# Patient Record
Sex: Female | Born: 1955 | Race: White | Hispanic: No | Smoking: Never smoker
Health system: Southern US, Community
[De-identification: ages and names within clinical notes are randomized; demographics above are authoritative.]

## PROBLEM LIST (undated history)

## (undated) DIAGNOSIS — K219 Gastro-esophageal reflux disease without esophagitis: Secondary | ICD-10-CM

## (undated) DIAGNOSIS — N189 Chronic kidney disease, unspecified: Secondary | ICD-10-CM

## (undated) DIAGNOSIS — I10 Essential (primary) hypertension: Secondary | ICD-10-CM

## (undated) DIAGNOSIS — E119 Type 2 diabetes mellitus without complications: Secondary | ICD-10-CM

## (undated) DIAGNOSIS — E7800 Pure hypercholesterolemia, unspecified: Secondary | ICD-10-CM

## (undated) DIAGNOSIS — E669 Obesity, unspecified: Secondary | ICD-10-CM

## (undated) DIAGNOSIS — E039 Hypothyroidism, unspecified: Secondary | ICD-10-CM

## (undated) DIAGNOSIS — M1990 Unspecified osteoarthritis, unspecified site: Secondary | ICD-10-CM

## (undated) HISTORY — PX: FOOT SURGERY: SHX648

## (undated) HISTORY — DX: Obesity, unspecified: E66.9

## (undated) HISTORY — DX: Gastro-esophageal reflux disease without esophagitis: K21.9

## (undated) HISTORY — DX: Essential (primary) hypertension: I10

## (undated) HISTORY — DX: Pure hypercholesterolemia, unspecified: E78.00

## (undated) HISTORY — DX: Hypothyroidism, unspecified: E03.9

## (undated) HISTORY — DX: Unspecified osteoarthritis, unspecified site: M19.90

## (undated) HISTORY — DX: Type 2 diabetes mellitus without complications: E11.9

## (undated) HISTORY — DX: Chronic kidney disease, unspecified: N18.9

## (undated) HISTORY — PX: TUBAL LIGATION: SHX77

---

## 1999-01-06 ENCOUNTER — Other Ambulatory Visit: Admission: RE | Admit: 1999-01-06 | Discharge: 1999-01-06 | Payer: Self-pay | Admitting: Family Medicine

## 1999-05-24 ENCOUNTER — Encounter: Admission: RE | Admit: 1999-05-24 | Discharge: 1999-07-08 | Payer: Self-pay | Admitting: *Deleted

## 1999-07-12 ENCOUNTER — Ambulatory Visit (HOSPITAL_COMMUNITY): Admission: RE | Admit: 1999-07-12 | Discharge: 1999-07-12 | Payer: Self-pay | Admitting: Occupational Medicine

## 1999-07-12 ENCOUNTER — Encounter: Payer: Self-pay | Admitting: Occupational Medicine

## 1999-07-29 ENCOUNTER — Encounter: Admission: RE | Admit: 1999-07-29 | Discharge: 1999-07-29 | Payer: Self-pay | Admitting: Family Medicine

## 1999-07-29 ENCOUNTER — Encounter: Payer: Self-pay | Admitting: Family Medicine

## 1999-11-02 ENCOUNTER — Ambulatory Visit (HOSPITAL_COMMUNITY): Admission: RE | Admit: 1999-11-02 | Discharge: 1999-11-02 | Payer: Self-pay | Admitting: Orthopedic Surgery

## 1999-11-02 ENCOUNTER — Encounter: Payer: Self-pay | Admitting: Orthopedic Surgery

## 1999-12-17 ENCOUNTER — Encounter: Payer: Self-pay | Admitting: Orthopedic Surgery

## 1999-12-17 ENCOUNTER — Ambulatory Visit (HOSPITAL_COMMUNITY): Admission: RE | Admit: 1999-12-17 | Discharge: 1999-12-17 | Payer: Self-pay | Admitting: Orthopedic Surgery

## 2000-03-01 ENCOUNTER — Ambulatory Visit (HOSPITAL_COMMUNITY): Admission: RE | Admit: 2000-03-01 | Discharge: 2000-03-01 | Payer: Self-pay | Admitting: Family Medicine

## 2000-03-01 ENCOUNTER — Encounter: Payer: Self-pay | Admitting: Family Medicine

## 2003-01-23 ENCOUNTER — Ambulatory Visit (HOSPITAL_COMMUNITY): Admission: RE | Admit: 2003-01-23 | Discharge: 2003-01-23 | Payer: Self-pay | Admitting: Family Medicine

## 2003-02-25 ENCOUNTER — Other Ambulatory Visit: Admission: RE | Admit: 2003-02-25 | Discharge: 2003-02-25 | Payer: Self-pay | Admitting: Family Medicine

## 2004-02-15 DIAGNOSIS — G459 Transient cerebral ischemic attack, unspecified: Secondary | ICD-10-CM

## 2004-02-15 HISTORY — DX: Transient cerebral ischemic attack, unspecified: G45.9

## 2004-09-29 ENCOUNTER — Emergency Department (HOSPITAL_COMMUNITY): Admission: EM | Admit: 2004-09-29 | Discharge: 2004-09-29 | Payer: Self-pay | Admitting: Emergency Medicine

## 2004-10-01 ENCOUNTER — Inpatient Hospital Stay (HOSPITAL_COMMUNITY): Admission: EM | Admit: 2004-10-01 | Discharge: 2004-10-05 | Payer: Self-pay | Admitting: Emergency Medicine

## 2004-10-02 ENCOUNTER — Ambulatory Visit: Payer: Self-pay | Admitting: Cardiology

## 2004-10-04 ENCOUNTER — Encounter: Payer: Self-pay | Admitting: Cardiology

## 2004-11-12 ENCOUNTER — Ambulatory Visit: Payer: Self-pay | Admitting: Cardiology

## 2004-12-02 ENCOUNTER — Ambulatory Visit: Payer: Self-pay | Admitting: Cardiology

## 2004-12-29 ENCOUNTER — Encounter: Admission: RE | Admit: 2004-12-29 | Discharge: 2004-12-29 | Payer: Self-pay | Admitting: Neurology

## 2005-04-07 ENCOUNTER — Ambulatory Visit: Payer: Self-pay | Admitting: Cardiology

## 2005-09-15 ENCOUNTER — Ambulatory Visit: Payer: Self-pay | Admitting: Cardiology

## 2006-04-06 ENCOUNTER — Ambulatory Visit: Payer: Self-pay | Admitting: Cardiology

## 2007-02-27 ENCOUNTER — Ambulatory Visit (HOSPITAL_COMMUNITY): Admission: RE | Admit: 2007-02-27 | Discharge: 2007-02-27 | Payer: Self-pay | Admitting: Orthopedic Surgery

## 2007-03-29 ENCOUNTER — Ambulatory Visit: Payer: Self-pay | Admitting: Family Medicine

## 2007-03-29 ENCOUNTER — Inpatient Hospital Stay (HOSPITAL_COMMUNITY): Admission: EM | Admit: 2007-03-29 | Discharge: 2007-04-01 | Payer: Self-pay | Admitting: Emergency Medicine

## 2008-07-17 ENCOUNTER — Telehealth: Payer: Self-pay | Admitting: Cardiology

## 2010-03-06 ENCOUNTER — Encounter: Payer: Self-pay | Admitting: Family Medicine

## 2010-03-07 ENCOUNTER — Encounter: Payer: Self-pay | Admitting: Family Medicine

## 2010-03-31 ENCOUNTER — Other Ambulatory Visit (HOSPITAL_COMMUNITY): Payer: Self-pay | Admitting: Family Medicine

## 2010-03-31 DIAGNOSIS — Z1231 Encounter for screening mammogram for malignant neoplasm of breast: Secondary | ICD-10-CM

## 2010-04-06 ENCOUNTER — Ambulatory Visit (HOSPITAL_COMMUNITY)
Admission: RE | Admit: 2010-04-06 | Discharge: 2010-04-06 | Disposition: A | Discharge status: Home / Self Care | Payer: Medicare HMO | Source: Ambulatory Visit | Attending: Family Medicine | Admitting: Family Medicine

## 2010-04-06 ENCOUNTER — Encounter (HOSPITAL_COMMUNITY): Payer: Self-pay

## 2010-04-06 DIAGNOSIS — Z1231 Encounter for screening mammogram for malignant neoplasm of breast: Secondary | ICD-10-CM | POA: Insufficient documentation

## 2010-06-29 NOTE — H&P (Signed)
Sherry Herrera, Sherry Herrera             ACCOUNT NO.:  [id number]   MEDICAL RECORD NO.:  [id number]          PATIENT TYPE:  INP   LOCATION:  3035                         FACILITY:  [facility]   PHYSICIAN:  Zenaida Deed. Mayford Knife, M.D.DATE OF BIRTH:  1955/12/14   DATE OF ADMISSION:  03/29/2007  DATE OF DISCHARGE:                              HISTORY & PHYSICAL   CHIEF COMPLAINT:  Headache x2 weeks.   HISTORY OF PRESENT ILLNESS:  The patient is a 55 year old woman who was  in her usual state of health 2 weeks ago when she recalls gradual onset  of nausea, vomiting then headache.  The syndrome has been persistent  since then.  She visited her PCP 2 weeks ago who performed some lab  tests and then sent her to [facility] for CT and LP.  Review of  [facility] ER records reveal normal CT scan.  LP was performed  after two attempts and noted to be traumatic.  Cultures were sent which  were negative x72 hours, Gram stain with PMNs, no organisms.  Glucose  44, protein 116, grossly bloody, WBCs 250, 87% neutrophils, RBCs 143.  CBC with WBC 12.2, platelets 401.  UA negative.  Potassium was 3.4.  No  viral PCR studies were sent.   The patient notes subjective fever and chills.  She denies visual  changes, photophobia, phonophobia.  Headache is pulsating along the  right upper temporal region.  Worse with head movement.  She notes  longstanding mild shortness of breath without chest pain.  She denies  motor focality acutely.  She notes a longstanding numbness in her hands  secondary to shoulder synovitis and biceps and rotator cuff pathology  previously diagnosed.  She is not able to keep down fluids or food.  She  had a bowel movement 2 days ago and says she feels dehydrated.   PAST MEDICAL HISTORY:  1. Right TIA 2006.  2. Hyperlipidemia.  3. Hypertension.  4. Chest pain with abnormal Myoview 2008 with apical ischemia, EF of      79%.  5. Intermittent shortness of breath not further  investigated.  6. Depression.  7. Obesity.  8. Shoulder synovitis.  9. Biceps tendinopathy.   PAST SURGICAL HISTORY:  1. Remote BTL.  2. 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  3. Family history of dyslipidemia, stroke; no history of coronary      artery disease.   SOCIAL HISTORY:  Denies tobacco or alcohol.  Lives in [place].  She is married.   ALLERGIES:  No known drug allergies.   MEDICATIONS:  1. Hydrochlorothiazide 50 mg daily.  2. Levothyroxine 25 mcg daily.  3. Metoprolol XL 50 mg daily.  4. Zoloft 50 mg daily.  5. Simvastatin 40 mg daily.   REVIEW OF SYSTEMS:  Reports chills, neck pain, hand paresthesia,  headache, nausea, vomiting, shortness of breath.  Denies phonophobia,  photophobia, chest pain, motor weakness, abdominal pain, dysuria,  changes in bowels, new rash.   PHYSICAL EXAMINATION:  VITAL SIGNS:  T max 97.7, heart rate 78, BP  167/84, respirations 18, O2 sat 97% on room air.  GENERAL:  The patient is lying on the ER bed with the lights off and  blanket over her head.  She is awake and alert.  HEENT:  Normocephalic, atraumatic.  Pupils equally round, reactive to  light.  Extraocular muscles are intact.  Funduscopy suggestive of  papilledema.  NECK:  Neck is tender at the occiput.  No JVD.  Heart rate regular.  No  murmur or thrill.  LUNGS:  Clear.  ABDOMEN:  Obese, nontender.  Positive bowel sounds.  No edema.  Diffuse  scaling plaque distal to ankle bilaterally.  NEUROLOGICAL:  Cranial nerves II-XII intact.  Upper extremity strength  5/5, sensation intact to touch.  No tremor.  Reflexes 2+.  Judgment and  insight intact.   LABORATORY AND SCANS:  CT head no acute process.  EKG with sinus  bradycardia, PVCs, LVH and nonspecific ST-T changes.  Review of EKG from  2007 [facility] reveals consistent findings.  Repeat rhythm  strip done on exam with heart rate 78.  CBC - WBC 10.3, hemoglobin 14.9,  platelets 405.  BMET - potassium  2.8, otherwise normal.  Troponin-I  0.01.  Urinalysis negative.   ASSESSMENT:  The patient is a 56 year old woman with headache, neck  pain, chills x2 weeks.  1. CNS:  No signs and symptoms of encephalitis.  No clear vector      exposure.  No exanthem or parotitis or cervical lymphadenopathy.      Rocephin given in the ER x1.  Will not continue.  CSF negative      culture, negative for [employer].  Will follow the      patient for viral meningitis.  [facility], they      still have CSF.  Have requested HSV, PCR.  Will give the patient      acyclovir and consider repeating CSF evaluation in the a.m. with      new LP.  Given report of RBCs and the CHF with the patient's      papilledema concerning for HSV.  The patient has no frank temporal      lobe symptoms which is somewhat reassuring.  At this time given      temporality of symptoms will not get Lyme, RMSF, CMV or EBV titers.      The patient has already gotten Rocephin so blood cultures at this      point would be nondiagnostic.  Will give morphine and antiemetics      for symptomatic relief.  2. Hypertension:  Elevated blood pressure with papilledema.      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  No evidence of      cerebrovascular event on CT.  The patient has prior cardiac      ischemia on Myoview and chronicity of hypertension is unknown so I      am somewhat hesitant to aggressively lower BP at this time.      However, the patient needs control.  Will initiate carvedilol with      calcium channel blocker.  Given hypokalemia and electrolyte      abnormalities will hold hydrochlorothiazide.  While the patient has      earlier finding of bradycardia with papilledema she does not have      an increased respiratory rate and does not fit the Cushing's triad.      Will follow for recurrent bradycardia.  3. F/E/N/GI:  Hypokalemic.  Will replete with potassium runs and then      give  intravenous fluids.   Keep n.p.o. for now except for      medications.  Morphine, antiemetics.  Will follow I's and O's and      give docusate senna to compensate for morphine effects.  Zofran IV      with staggered Phenergan as needed.  Continue levothyroxine.  4. Hypercholesterolemia:  Will check a.m. lipid panel and CMET.  5. Prophylaxis:  Protonix 40 mg IV daily.  Given LP recently will hold      Lovenox and place SCDs.   DISPOSITION:  Pending HSV titers, clinical course and question of repeat  LP.      Towana Badger, M.D.  Electronically Signed      Zenaida Deed. Mayford Knife, M.D.  Electronically Signed    JP/MEDQ  D:  03/29/2007  T:  03/31/2007  Job:  [id number]

## 2010-07-02 NOTE — Consult Note (Signed)
NAMEVERNIE, VINCIGUERRA             ACCOUNT NO.:  [id number]   MEDICAL RECORD NO.:  [id number]          PATIENT TYPE:  INP   LOCATION:  3036                         FACILITY:  [facility]   PHYSICIAN:  Jonelle Sidle, M.D. LHCDATE OF BIRTH:  1955/03/19   DATE OF CONSULTATION:  10/02/2004  DATE OF DISCHARGE:                                   CONSULTATION   PRIMARY CARE PHYSICIAN:  Dr. Manson Passey in [facility].   REASON FOR CONSULTATION:  Left arm discomfort.   HISTORY OF PRESENT ILLNESS:  Sherry Herrera is a pleasant 55 year old woman  with a reported history of hypertension, depression and apparently  borderline dyslipidemia.  She is admitted to the hospital now given concerns  for possible transient ischemic attack, is undergoing neurological  evaluation.  She describes developing a sensation of numbness that began  this past Wednesday when she stood up to adjust the controls of an air  conditioner in her daughters house.  She states that she stood up and walked  a few steps, reaching up with her right arm and then noticed a numbness  beginning in the fingertips of her left arm which gradually crawled up her  left arm to the level of her face, subsequently experiencing numbness in the  perioral area and left half of her face.  This lasted for 10 seconds and  abated.  She states that it happened again when she stood up a little while  later and then for a third time prompting evaluation at a local emergency  department.  The patient tells me that she was evaluated and discharged  apparently without any acute findings.  She had on additional episode that  day, but then apparently none until presenting to the hospital yesterday.  Since hospitalization she has not had any further symptoms.   Sherry Herrera denies having antecedent history of exertional chest pain or  shortness of breath, although she does state that she had a slight episode  of chest pain, she was playing with her two  granddaughters two weeks ago.  She was running the hall when this occurred.  With the recent episodes of  left arm numbness, she has not experienced any chest pain or shortness of  breath.  She denies any prior cardiovascular testing or cardiac events.  Of  note her recent point of care cardiac markers are normal.  She does have a  potassium of 2.9 and has been on hydrochlorothiazide 50 mg p.o. daily  without potassium supplementation.  Her electrocardiogram showed sinus  rhythm at 80 beats per minute with nonspecific ST-T wave changes and  baseline artifact.  T waves are somewhat flat and actual QT interval is hard  to completely assess, although could be mildly prolonged.  Chest x-ray is  reported as showing no acute cardiopulmonary disease and CT scan of the head  from the 16th is reported as negative for acute event.  We have been asked  to evaluate the patient further.   ALLERGIES:  NO KNOWN DRUG ALLERGIES.   MEDICATIONS AT HOME:  1.  Hydrochlorothiazide 50 mg p.o. daily.  2.  Zoloft 50 mg p.o. daily.  3.  Aspirin 81 mg p.o. daily.   PAST MEDICAL HISTORY:  1.  Hypertension.  The patient was previously on an ACE inhibitor although      this was discontinued due to cough.  She has been treated since 1997.  2.  Borderline dyslipidemia.  3.  Depression.   SOCIAL HISTORY:  The patient denies any significant tobacco or alcohol use.  She lives in [place].  She has no major functional limitations.   FAMILY HISTORY:  Significant for dyslipidemia as well as stroke in the  patient's father.  No clear history of premature cardiovascular disease.   REVIEW OF SYSTEMS:  As described in history of present illness.  She has  difficulties with right back/hip pain apparently due to previous work-  related injury.  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  She has had some  shortness of breath with exertion over the last several weeks.  Otherwise  systems are negative.   EXAMINATION:  The  patient is afebrile, temperature 98.6, blood pressure  156/86, heart rate 68, respirations 20, oxygen saturation is 98% on room  air.  GENERAL:  This is an overweight woman asleep in bed in no acute distress.  HEENT:  Conjunctivae and lids normal.  Pharynx is clear.  NECK:  Supple without elevated jugular venous pressure, without carotid  bruits.  No thyromegaly is noted.  LUNGS:  Clear with nonlabored breathing at rest.  CARDIAC:  Exam reveals a regular rate and rhythm without loud murmur or S3  gallops, there is no pericardial rub.  ABDOMEN:  Soft with no obvious hepatomegaly or bruits, no tenderness noted .  EXTREMITIES:  Show no pitting edema, peripheral pulses are 2+.  SKIN:  No ulcer changes are noted.  MUSCULOSKELETAL:  No kyphosis is noted.  NEUROPSYCHIATRIC:  The patient is alert and oriented x3.  She has no focal  weakness of the upper or lower extremities.  Sensation grossly intact to  light-touch.  Face symmetrical.   LABORATORY DATA:  Hemoglobin 10.0, WBC 11.0, platelets 523, MCV 67.2, sodium  135, potassium 2.9, chloride 101, bicarb 25, BUN 10, creatinine 0.8, glucose  121, INR 0.9, total cholesterol 207 with LDL of 117, HDL 42, triglycerides  239, glycosylated hemoglobin 6.1%, homocystine level pending.   IMPRESSION:  1.  Recent symptoms of left arm and face numbness precipitated largely when      standing and to some degree with exertion.  The patient has had no frank      reproducible exertional chest pain, although did experience one episode      of such approximately two weeks ago and has had some shortness of breath      with exertion.  Her initial point of care cardiac markers are normal and      electrocardiogram is nonspecific.  2.  Longstanding history of hypertension, on hydrochlorothiazide.  3.  Hypokalemia, likely secondary to hydrochlorothiazide without potassium      supplementation. 4.  Mild dyslipidemia as discussed above.  5.  Anemia, microcytic based  on MCV.  Uncertain whether this is chronic or      not.  The patient denies any obvious bleeding problems.   RECOMMENDATIONS:  1.  At this point would cycle a full set of cardiac markers and repeat      electrocardiogram.  We discussed the basic risk stratification for      cardiovascular disease and will tentatively plan on an exercise Myoview  assuming cardiac markers and electrocardiogram are reassuring.  This is      also assuming that no other major active neurological issues are felt to      be ongoing.  2.  Replete potassium.  The patient will most likely need a standing      potassium supplement, particularly if she continues on      hydrochlorothiazide.  3.  The patient will need a full evaluation for her anemia ultimately.  At      this point we will assist by checking an iron profile and guaiac stools.  4.  Further recommendations to follow.           ______________________________  Jonelle Sidle, M.D. [facility]     SGM/MEDQ  D:  10/02/2004  T:  10/02/2004  Job:  [id number]

## 2010-07-02 NOTE — Assessment & Plan Note (Signed)
[facility]                            CARDIOLOGY OFFICE NOTE   DEMIAH, GULLICKSON                      MRN:          [id number]  DATE:04/06/2006                            DOB:          09-19-1955    PRIMARY CARE PHYSICIAN:  Dr. Manson Passey, at the [facility] in [place].   REASON FOR VISIT:  Cardiac followup.   HISTORY OF PRESENT ILLNESS:  I saw Ms. Haller back in August.  Her  history is outlined previously including an abnormal Myoview, suggesting  the possibility of apical ischemia although with normal left ventricular  systolic function and no ischemic electrocardiographic changes.  She has  been managed medically at her request.  We have not pursued any  additional invasive testing.  Symptomatically she is not reporting any  progressive chest pain or limiting dyspnea on exertion.  She has a  number of other complaints including intermittent vertigo, occasional  shortness of breath although not progressive, occasional left arm  paresthesia, shoulder and back pain and sometimes an unusual feeling of  warm water in her right leg.  She has not had regular neurology  followup.  She also voiced some concerns about possibly being evaluated  for diabetes and plans to discuss this with Dr. Manson Passey.  She has had no  palpitations or syncope.  Her electrocardiogram today shows sinus rhythm  with voltage criteria, as noted previously, and nonspecific ST T wave  changes.  Today I discussed with her our present management and reviewed  her medications.   ALLERGIES:  NO KNOWN DRUG ALLERGIES.   PRESENT MEDICATIONS:  1. Aspirin 325 mg p.o. daily.  2. Hydrochlorothiazide 50 mg p.o. daily.  3. Zoloft 50 mg p.o. daily.  4. Toprol XL 50 mg p.o. daily.  5. Nexium 40 mg p.o. daily.  6. Neurontin 300 mg p.o. 1-3 tablets daily.  7. Levoxyl 25 mcg p.o. daily.  8. Arthrotec 75/0.2 mg p.o. b.i.d.  9. Vytorin 10/40 mg p.o. daily.  10.Meclizine 25 mg p.o. p.r.n.  11.She also has sublingual nitroglycerin p.r.n.   REVIEW OF SYSTEMS:  As described in the History of Present Illness.   EXAMINATION:  Blood pressure today 156/73, heart rate 87, weighs 179  pounds.  The patient is comfortable and in no acute distress.  NECK:  No elevated jugular venous pressure without bruits.  No  thyromegaly is noted.  LUNGS:  Clear without labored breathing.  CARDIAC:  A regular rate and rhythm without loud murmur or S3 gallop.  EXTREMITIES:  No significant pitting edema.  SKIN:  Warm and dry.   IMPRESSION RECOMMENDATION:  1. History of abnormal myocardial perfusion study, as outlined      previously, suggesting possible apical ischemia, although with      normal left ventricular systolic function and no ischemic      electrocardiographic changes.  Plan is to continue medical therapy      at this point.  I discussed this again with the patient today and      she continues to prefer not to pursue any invasive evaluation at  this point.  She is not describing any progressive angina or major      change in her baseline dyspnea on exertion.  She states that her      Toprol was recently increased and I agree with this change.  I have      recommended that she in the meanwhile continue to follow up with      Dr. Manson Passey and we will see her back over the next 6 months for      symptom review, assuming she continues to remain stable.  2. Further plans to follow.     Jonelle Sidle, MD  Electronically Signed    SGM/MedQ  DD: 04/06/2006  DT: 04/06/2006  Job #: [id number]

## 2010-07-02 NOTE — Discharge Summary (Signed)
Sherry Herrera, Sherry Herrera             ACCOUNT NO.:  [id number]   MEDICAL RECORD NO.:  [id number]          PATIENT TYPE:  INP   LOCATION:  3035                         FACILITY:  [facility]   PHYSICIAN:  Leighton Roach McDiarmid, M.D.DATE OF BIRTH:  1955-09-20   DATE OF ADMISSION:  03/29/2007  DATE OF DISCHARGE:  04/01/2007                               DISCHARGE SUMMARY   DISCHARGE DIAGNOSES:  1. Headache likely migraine.  2. Hypertension.  3. Hypothyroidism.  4. Depression.  5. Hyperlipidemia.   DISCHARGE MEDICATIONS:  1. Hydrochlorothiazide 12.5 mg daily.  2. Zocor 40 mg daily.  3. Zoloft 50 mg daily.  4. Synthroid 25 mcg daily.  5. Senna S 2 tablets twice daily.  6. Norvasc 10 mg daily.  7. Coreg 3.125 mg b.i.d.  8. Protonix 40 mg daily.  9. Compazine 10 mg q.6 h. p.r.n. pain.  10.Percocet 5/325 one to two tabs q.4 h. p.r.n. pain.   BRIEF HISTORY OF PRESENT ILLNESS:  Sherry Herrera is a 55 year old female  who was in her usual state of health until 2 weeks prior to presentation  to the emergency department when she began experiencing the gradual  onset of nausea and vomiting and then headaches.  This headache with the  nausea and vomiting had been persistent up to her admission.  She was  seen at an outside hospital where she received a CT scan and a lumbar  puncture.  Her records were obtained from the outside hospital and  showed that the LP was performed after 2 attempts and was traumatic.  Cultures were sent which were negative x72 hours.  The Gram stain showed  polymorphonuclear cells but no organisms.  Glucose was 44, protein 116.  The specimen was grossly bloody.  White blood cells were 250 with 87%  neutrophils and 143 RBCs.  CBC showed a white blood cell count of 12.2,  platelets of 401.  No PCR studies were sent by the outside hospital.  The patient did note subjective fevers and chills but denied any visual  changes, photophobia or phonophobia.  She described the headache as  pulsating along the right upper temporal region and was worse with head  movement but denied any motor symptoms.  Given the prolonged course of  her headache, vomiting and chills as well as neck pain and stiffness she  was admitted for observation and treatment for possible encephalitis.  She had no signs or symptoms other than headache and neck stiffness and  had no known exposure to ticks, other sick individuals or any other  factors.  She was given 1 dose of ceftriaxone in the ED but this was not  continued.  The CSF was negative at the outside hospital.  We did not  feel the need to retap this patient.  We did contact the outside  hospital and they did have a tube that had been held on ice for any  further studies that should be needed.  This was sent over to our  hospital and the CSF was sent for HSV/PCR which did subsequently come  back negative.  However, in the interim she  was placed on IV acyclovir.  Initially her headache really did not improve until scheduled Compazine,  Percocet and ibuprofen were initiated.  After approximately 6-8 hours on  this regimen the patient's headache had subsided substantially and on  the morning of February 14 the patient was feeling much better.  The  headache was only barely noticeable and the patient was no longer  nauseous or vomiting and was feeling more like herself.  We did continue  the scheduled pain regimen through the rest of the day.  On February 14  the HSV/PCR did come back and was negative.  Therefore, on the morning  of February 15 since the PCR had been negative acyclovir was  discontinued.  Her headache still remained improved so the patient was  sent out on Compazine, ibuprofen and Percocet as needed.  She was  maintained on her home doses of Zocor, Zoloft, Synthroid, Norvasc, Coreg  and hydrochlorothiazide to treat her other co-morbidities and chronic  medical condition.   PROCEDURES:  The patient did undergo an MRI which did  show a very small  area of enhancement.  Neurology was contacted to discuss these findings  with them.  It is very common for this kind of enhancement to occur  after a lumbar puncture even a week to 10 days afterwards.  It was not  consistent with any kind of infection or bleed.  It was recommended that  if her headache did not completely subside in the next 1-2 weeks that  the MRI be repeated that this area of enhancement had not continued.   DISPOSITION:  The patient was discharged home.  Condition at time of  discharge improved and stable.   FOLLOWUP:  The patient was to schedule a followup with her primary care  physician within 1-2 weeks of discharge to follow up these issues and to  ensure that her headache had resolved.  Had it not she would need to be  sent for repeat MRI to ascertain if the very small area of enhancement  originally seen had improved.      Ardeen Garland, MD  Electronically Signed      Leighton Roach McDiarmid, M.D.  Electronically Signed    LM/MEDQ  D:  04/27/2007  T:  04/28/2007  Job:  [id number]

## 2010-07-02 NOTE — Discharge Summary (Signed)
Sherry, Herrera             ACCOUNT NO.:  [id number]   MEDICAL RECORD NO.:  [id number]          PATIENT TYPE:  INP   LOCATION:  3036                         FACILITY:  [facility]   PHYSICIAN:  Gustavus Messing. Orlin Hilding, M.D.DATE OF BIRTH:  10/27/1955   DATE OF ADMISSION:  10/01/2004  DATE OF DISCHARGE:  10/05/2004                                 DISCHARGE SUMMARY   DISCHARGE DIAGNOSES:  1.  Right brain transient ischemic attack.  2.  Dyslipidemia.  3.  Hypertension.  4.  Chest pain, may be reversible ischemia.  5.  Hypokalemia.   DISCHARGE MEDICATIONS:  1.  Aspirin 325 mg a day.  2.  Hydrochlorothiazide 50 mg a day.  3.  Zoloft 50 mg a day.  4.  Zocor 40 mg a day.  5.  Ferrous sulfate 325 mg a day.  6.  Toprol 25 mg a day.   STUDIES:  1.  CT of the brain on admission showed no acute abnormality.  2.  Chest x-ray showed no active disease.  3.  MRI of the brain showed no acute infarct, greater than expected number      of T2 signal hyperintensities within the subcortical white matter,      probably in the frontal lobe, nonspecific.  Maxillary sinus disease.  4.  MRA of the head shows multifocal areas of intracranial irregularity and      narrowing consistent with atherosclerosis, left greater than right      posterior cerebral artery involvement, bilateral anterior cerebral      artery, proximal left M1 segment and left greater than right MCA branch      vessels.  Prominent left vert.  Right vert ends in PICA.  5.  MRA of the neck shows no significant internal carotid artery stenosis,      attenuated flow in the right vert ending in the PICA, significant      intracranial atherosclerosis.  6.  Chest x-ray shows normal sinus rhythm, moderate voltage criteria for      LVH.  7.  Nuclear medicine study does have a small anterior apical defect which      may be due in part to breast attenuation but does have a reversible      component which may represent ischemia.  8.  Carotid  Doppler is normal.  9.  A 2-D echocardiogram shows EF of 55 to 65% and no embolic source.  10. Laboratory studies: Multiple cardiac enzymes were normal.  White blood      cells 11, hemoglobin 10, hematocrit 30.3, MCV 67.2, MCHC 32.9, RDW 15.9,      platelets 523.  Differential was normal.  Coagulation studies on      admission were normal.  Chemistries with potassium 2.9, glucose 121.      Liver function tests normal.  Hemoglobin A1c 6.1.  Homocysteine 6.51.      Cholesterol 207, triglycerides 239, HDL 42, LDL 117, is 21 low.   HISTORY OF PRESENT ILLNESS:  Sherry Herrera is a 55 year old white female  with history of hypertension who had 3 episodes on August 16 of left upper  extremity sudden onset numbness associated with exertion, lasting about 10  seconds and resolving.  Came to the [facility] Emergency Room where CT of  the head was negative.  They thought she may have had a possible TIA;  however, since it was associated with exertion, felt there may be associated  cardiac component.  The patient was admitted to the hospital for further  evaluation.   HOSPITAL COURSE:  MRI was negative for acute infarct.  A Myoview was  performed by [facility] demonstrating an apical defect.  They were  unsure if this reversible ischemia versus breast attentuation.  They  discussed this with the patient, and she opted for medical treatment.  She  will follow up with Dr. Diona Browner as an outpatient and consider further  workup in the future if indicated.  He agrees with aspirin 325 mg daily for  prevention.  Risk factors for stroke include elevated cholesterol for which  increased Zocor from 10 to 40 and was placed on Toprol.  She goes to [facility] in [place], and she will got there to get her medications filled.  She is to follow up with a neurologist in 2 to 3 months to make sure she has  had no recurrent symptoms.   Of note, her potassium was low on arrival at 2.9.  We will recheck  prior to  discharge and add potassium if indicated.   CONDITION ON DISCHARGE:  Patient neurologically normal.   DISCHARGE PLAN:  1.  Discharge home with family.  2.  Aspirin for heart and stroke prevention.  3.  Increase Lipitor.  Follow up in 6 weeks at the [facility].  4.  Check potassium and add potassium if indicated.      Annie Main, N.P.      Catherine A. Orlin Hilding, M.D.  Electronically Signed    SB/MEDQ  D:  10/05/2004  T:  10/05/2004  Job:  [id number]   cc:   Pramod P. Pearlean Brownie, MD  Fax: [phone]   Jonelle Sidle, M.D. [facility]  [address]  [place]  [place] [address]   [facility], Dr. Manson Passey

## 2010-07-02 NOTE — Assessment & Plan Note (Signed)
[facility]                              CARDIOLOGY OFFICE NOTE   Sherry Herrera, Sherry Herrera                      MRN:          [id number]  DATE:09/15/2005                            DOB:          [birth year]    PRIMARY CARE PHYSICIAN:  Dr. Manson Passey at the [facility] in [place].   REASON FOR VISIT:  Routine follow-up.   HISTORY OF PRESENT ILLNESS:  Sherry Herrera comes in for a routine visit,  having last been seen in February.  Her history is outlined in my previous  note.  She has not had any significant episodes of exertional angina or  limiting dyspnea on exertion, and has not had to use any sublingual  nitroglycerin.  Her electrocardiogram is stable today showing sinus rhythm  at 71 beats per minute, with increased voltage and nonspecific ST-T wave  changes.  I note that she is no longer taking Zocor, and apparently this is  not being provided through the [facility] at this point, although it  sounds like Vytorin is.  She also needed a prescription for her  hydrochlorothiazide.  She otherwise has orthopedic complaints and tells me  that she plans to see an orthopedist soon.   ALLERGIES:  No known drug allergies.   PRESENT MEDICATIONS:  1.  Aspirin 325 mg p.o. daily.  2.  Hydrochlorothiazide 50 mg p.o. every day.  3.  Zoloft 50 mg p.o. daily.  4.  Toprol XL 25 mg p.o. daily.  5.  Nexium 40 mg p.o. daily.  6.  Neurontin 300 mg, one to three tablets p.o. daily.  7.  Levoxyl 25 mcg p.o. daily.  8.  Arthrotec 75/0.2 mg p.o. b.i.d.  9.  Sublingual nitroglycerin p.r.n. (has not used).   REVIEW OF SYSTEMS:  As described in history of present illness.  She  complains of left shoulder weakness and discomfort.  She also has bilateral  hand numbness sometimes in the evenings and continues to have lower  extremity paresthesias.   PHYSICAL EXAMINATION:  VITAL SIGNS:  Blood pressure today is 142/70, heart  rate is 71, weight is 184 pounds.  GENERAL:   The patient is comfortable and in no acute distress.  NECK:  Examination reveals no elevated jugular venous pressure or loud  bruits.  LUNGS:  Clear without labored breathing.  CARDIAC:  Regular rate and rhythm without loud murmur or gallop.  EXTREMITIES:  Extremities show no significant pitting edema.   IMPRESSION AND RECOMMENDATIONS:  1.  History of previous abnormal myocardial perfusion study suggesting      possible apical ischemia, although with normal left ventricular function      and no ischemic electrocardiographic changes.  Sherry Herrera continues to      deny any significant exertional angina or limiting shortness of breath.      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  We talked a basic walking      regimen again today, and we will plan to continue medical therapy.  I  gave her a prescription for hydrochlorothiazide and also for Vytorin      10/40 mg p.o. q.h.s. to get through the [facility].  We will otherwise      plan to see her back over the next six months.  2.  Hypertension, followed by Dr. Manson Passey.                                Jonelle Sidle, MD    SGM/MedQ  DD:  09/15/2005  DT:  09/15/2005  Job #:  [id number]   cc:   Sharlet Salina, MD

## 2010-07-02 NOTE — Consult Note (Signed)
Sherry Herrera, KAPUSTA             ACCOUNT NO.:  [id number]   MEDICAL RECORD NO.:  [id number]          PATIENT TYPE:  INP   LOCATION:  3036                         FACILITY:  [facility]   PHYSICIAN:  Casimiro Needle L. Reynolds, M.D.DATE OF BIRTH:  23-Mar-1955   DATE OF CONSULTATION:  DATE OF DISCHARGE:                                   CONSULTATION   CHIEF COMPLAINT:  Left arm numbness.   HISTORY OF PRESENT ILLNESS:  This is the first [facility]  admission for this 55 year old woman with a past medical history which  includes hypertension. The patient  had three episodes on August 16 of  sudden onset left upper extremity numbness usually associated with exertion,  lasting about 10 seconds, and then resolving, sometimes spreading into the  face. She came to [facility] emergency room that day with those symptoms.  Had a CT of the head and labs which were negative and was diagnosed with a  possible TIA. She was discharged on baby aspirin. Her symptoms have  continued in the last two days. Today, she had two more spells similar to  the above but a little more intense, associated with left tongue numbness as  well as shortness of breath. She says they both occurred on exertion and  that mostly the spells occur in the morning when she is up and around doing  things. She denies any associated chest pain or palpitations. She denies any  prior history of any similar events. She came to the emergency room tonight  further evaluation and is admitted for further considerations, specifically  regarding possible neurovascular events.   PAST MEDICAL HISTORY:  She reports a history of hypertension dating back  several years. She used to see Dr. Clyde Canterbury and now sees a doctor in  [place]. She says her blood pressure has not been under good control  lately. She also reports a history of borderline cholesterol, but she is  unaware of any history of high blood sugars. She is also on medications for  depression.   FAMILY HISTORY:  Her father had several strokes and died of a stroke. She  has multiple siblings with hypertension and hypercholesterolemia.   SOCIAL HISTORY:  She denies tobacco or alcohol use. She is normally  independent in her activities of daily living.   ALLERGIES:  No known drug allergies.   MEDICATIONS:  1.  Hydrochlorothiazide 50 mg daily  2.  Zoloft 50 mg daily.  3.  Baby aspirin for the last two days.   REVIEW OF SYSTEMS:  She has low back pain with right lower extremity pain  which dates back to an on the job injury of 2001. She has had increased  shortness of breath over the last couple of weeks. Otherwise, full 10-system  review of systems is negative except as outlined in the HPI and on the  admission nursing record.   PHYSICAL EXAMINATION:  VITAL SIGNS:  Temperature 99.7, blood pressure  165/71, pulse 85, respirations 18.  GENERAL:  This is an obese, healthy appearing woman supine in the hospital  bed in no evident distress.  HEENT:  Cranium is normocephalic, atraumatic. Oropharynx benign.  NECK:  Supple without carotid bruits.  HEART:  Regular rate and rhythm without murmurs.  CHEST:  Clear to auscultation bilaterally.  ABDOMEN:  Soft, normal active bowel sounds.  EXTREMITIES:  No edema. Two plus pulses.  NEUROLOGICAL:  Mental status:  She is awake and alert. Speech is slow but  not dysarthric. Mood is euthymic and affect appropriate. She is able to name  objects without difficulty and can repeat a phrase. Attention span,  concentration, fund of knowledge are all adequate. Cranial nerves:  Funduscopic exam is benign. Pupils are equal and briskly reactive.  Extraocular movements are full without nystagmus. Visual fields are full to  confrontation. Facial sensation is intact to pinprick. Face, tongue and  palate move normally and symmetrically. Motor normal. Bulk and tone normal.  Strength:  Did not test extremity muscles. Sensation intact to  pinprick and  light touch in all extremities. Coordination:  Rapid movements are performed  well. Finger/nose is performed well. Gait:  She arises from the gurney  easily, and her stance is normal. She is able to ambulate without difficulty  and perform Romberg maneuver without difficulty. Reflexes are 2+ and  symmetric. Toes are downgoing.   LABORATORY DATA:  CBC:  White count 11.0, hemoglobin 10.0, platelets 523,000  with normal differential. BMET is remarkable for a low potassium of 2.9 and  a mildly elevated glucose of 121. Liver functions are normal. Coags are  normal. CT of the head is personally reviewed. This study was performed  September 29, 2004 and was read out as normal, but I wonder if there might be  some tiny lacunes in the area of the right external capsule.   IMPRESSION:  Intermittent left upper extremity numbness. The fact that this  has spread to the face and today to the tongue suggests this could be small  vessel TIAs involving the right brain. However, there is also an exertional  component and association with some shortness of breath today which raises  the possibility of angina. She does have risk factors for both including  hypertension, hypercholesterolemia, and obesity.   PLAN:  Will admit to the hospital for stroke workup including MRI/MRA,  stroke labs, echocardiogram, transcranial Dopplers, and telemetry  monitoring. We will continue on aspirin for now. Pending the outcome of her  stroke workup, she might benefit from a cardiac workup as well. Stroke  service to follow.      Michael L. Thad Ranger, M.D.  Electronically Signed     MLR/MEDQ  D:  10/01/2004  T:  10/02/2004  Job:  [id number]

## 2010-08-15 HISTORY — PX: COLON RESECTION: SHX5231

## 2010-11-05 LAB — CBC
HCT: 42.2
HCT: 43.3
HCT: 39.9
Hemoglobin: 14.3
Hemoglobin: 14.9
MCHC: 34.5
MCHC: 34
MCHC: 34.5
MCV: 83.9
MCV: 84.8
MCV: 85.5
Platelets: 405 — ABNORMAL HIGH
Platelets: 399
RBC: 5.16 — ABNORMAL HIGH
RBC: 4.55
RBC: 4.7
RDW: 14.8
RDW: 15.3
WBC: 10.3

## 2010-11-05 LAB — I-STAT 8, (EC8 V) (CONVERTED LAB)
Acid-Base Excess: 5 — ABNORMAL HIGH
BUN: 17
Bicarbonate: 27.8 — ABNORMAL HIGH
Chloride: 102
Glucose, Bld: 91
HCT: 48 — ABNORMAL HIGH
Hemoglobin: 16.3 — ABNORMAL HIGH
Operator id: 222501
Potassium: 3 — ABNORMAL LOW
Sodium: 136
TCO2: 29
pCO2, Ven: 35.2 — ABNORMAL LOW
pH, Ven: 7.507 — ABNORMAL HIGH

## 2010-11-05 LAB — BASIC METABOLIC PANEL
BUN: 20
BUN: 6
BUN: 9
CO2: 25
CO2: 25
CO2: 27
Calcium: 9.6
Calcium: 9.1
Chloride: 98
Chloride: 103
Creatinine, Ser: 0.81
Creatinine, Ser: 0.69
Creatinine, Ser: 0.93
GFR calc Af Amer: >60
GFR calc Af Amer: >60
GFR calc Af Amer: >60
GFR calc non Af Amer: >60
Glucose, Bld: 100 — ABNORMAL HIGH
Potassium: 2.8 — ABNORMAL LOW
Potassium: 3.8
Sodium: 135

## 2010-11-05 LAB — DIFFERENTIAL
Basophils Absolute: 0
Basophils Relative: 0
Eosinophils Absolute: 0
Eosinophils Relative: 0
Lymphocytes Relative: 25
Lymphs Abs: 2.6
Monocytes Absolute: 0.9
Monocytes Relative: 9
Neutro Abs: 6.8
Neutrophils Relative %: 66

## 2010-11-05 LAB — HSV PCR
HSV 2 , PCR: NOT DETECTED
HSV, PCR: NOT DETECTED

## 2010-11-05 LAB — COMPREHENSIVE METABOLIC PANEL
ALT: 14
AST: 15
CO2: 24
Chloride: 96
GFR calc Af Amer: >60
GFR calc non Af Amer: >60
Sodium: 132 — ABNORMAL LOW
Total Bilirubin: 0.7

## 2010-11-05 LAB — URINALYSIS, ROUTINE W REFLEX MICROSCOPIC
Glucose, UA: NEGATIVE
Hgb urine dipstick: NEGATIVE
Ketones, ur: 15 — AB
Nitrite: NEGATIVE
Protein, ur: NEGATIVE
Specific Gravity, Urine: 1.031 — ABNORMAL HIGH
Urobilinogen, UA: 1
pH: 6

## 2010-11-05 LAB — LIPID PANEL
Cholesterol: 234 — ABNORMAL HIGH
LDL Cholesterol: 162 — ABNORMAL HIGH
Triglycerides: 171 — ABNORMAL HIGH

## 2010-11-05 LAB — POCT I-STAT CREATININE
Creatinine, Ser: 0.9
Operator id: 222501

## 2010-11-05 LAB — POTASSIUM: Potassium: 2.8 — ABNORMAL LOW

## 2010-11-05 LAB — TROPONIN I: Troponin I: 0.01

## 2012-07-19 ENCOUNTER — Encounter: Payer: Self-pay | Admitting: Internal Medicine

## 2012-07-19 LAB — HM MAMMOGRAPHY

## 2013-10-24 ENCOUNTER — Ambulatory Visit: Payer: Self-pay | Admitting: Family Medicine

## 2014-03-27 DIAGNOSIS — E1165 Type 2 diabetes mellitus with hyperglycemia: Secondary | ICD-10-CM | POA: Diagnosis not present

## 2014-03-27 DIAGNOSIS — E780 Pure hypercholesterolemia: Secondary | ICD-10-CM | POA: Diagnosis not present

## 2014-03-27 DIAGNOSIS — I10 Essential (primary) hypertension: Secondary | ICD-10-CM | POA: Diagnosis not present

## 2014-03-27 DIAGNOSIS — E038 Other specified hypothyroidism: Secondary | ICD-10-CM | POA: Diagnosis not present

## 2014-03-27 DIAGNOSIS — R1031 Right lower quadrant pain: Secondary | ICD-10-CM | POA: Diagnosis not present

## 2014-05-16 DIAGNOSIS — E119 Type 2 diabetes mellitus without complications: Secondary | ICD-10-CM | POA: Diagnosis not present

## 2014-05-16 DIAGNOSIS — H25043 Posterior subcapsular polar age-related cataract, bilateral: Secondary | ICD-10-CM | POA: Diagnosis not present

## 2014-05-16 DIAGNOSIS — H2513 Age-related nuclear cataract, bilateral: Secondary | ICD-10-CM | POA: Diagnosis not present

## 2014-06-03 DIAGNOSIS — H2512 Age-related nuclear cataract, left eye: Secondary | ICD-10-CM | POA: Diagnosis not present

## 2014-06-03 DIAGNOSIS — H25812 Combined forms of age-related cataract, left eye: Secondary | ICD-10-CM | POA: Diagnosis not present

## 2014-06-03 DIAGNOSIS — H25042 Posterior subcapsular polar age-related cataract, left eye: Secondary | ICD-10-CM | POA: Diagnosis not present

## 2014-06-11 DIAGNOSIS — H35341 Macular cyst, hole, or pseudohole, right eye: Secondary | ICD-10-CM | POA: Diagnosis not present

## 2014-07-28 DIAGNOSIS — E780 Pure hypercholesterolemia: Secondary | ICD-10-CM | POA: Diagnosis not present

## 2014-07-28 DIAGNOSIS — F329 Major depressive disorder, single episode, unspecified: Secondary | ICD-10-CM | POA: Diagnosis not present

## 2014-07-28 DIAGNOSIS — I10 Essential (primary) hypertension: Secondary | ICD-10-CM | POA: Diagnosis not present

## 2014-07-28 DIAGNOSIS — R21 Rash and other nonspecific skin eruption: Secondary | ICD-10-CM | POA: Diagnosis not present

## 2014-07-28 DIAGNOSIS — E038 Other specified hypothyroidism: Secondary | ICD-10-CM | POA: Diagnosis not present

## 2014-07-28 DIAGNOSIS — E1165 Type 2 diabetes mellitus with hyperglycemia: Secondary | ICD-10-CM | POA: Diagnosis not present

## 2014-07-30 DIAGNOSIS — E038 Other specified hypothyroidism: Secondary | ICD-10-CM | POA: Diagnosis not present

## 2014-07-30 DIAGNOSIS — R21 Rash and other nonspecific skin eruption: Secondary | ICD-10-CM | POA: Diagnosis not present

## 2014-07-30 DIAGNOSIS — E1165 Type 2 diabetes mellitus with hyperglycemia: Secondary | ICD-10-CM | POA: Diagnosis not present

## 2014-07-30 DIAGNOSIS — E780 Pure hypercholesterolemia: Secondary | ICD-10-CM | POA: Diagnosis not present

## 2014-07-30 DIAGNOSIS — F329 Major depressive disorder, single episode, unspecified: Secondary | ICD-10-CM | POA: Diagnosis not present

## 2014-07-30 DIAGNOSIS — I10 Essential (primary) hypertension: Secondary | ICD-10-CM | POA: Diagnosis not present

## 2014-08-12 DIAGNOSIS — H25041 Posterior subcapsular polar age-related cataract, right eye: Secondary | ICD-10-CM | POA: Diagnosis not present

## 2014-08-12 DIAGNOSIS — H2511 Age-related nuclear cataract, right eye: Secondary | ICD-10-CM | POA: Diagnosis not present

## 2014-08-12 DIAGNOSIS — H25811 Combined forms of age-related cataract, right eye: Secondary | ICD-10-CM | POA: Diagnosis not present

## 2014-10-08 DIAGNOSIS — Z961 Presence of intraocular lens: Secondary | ICD-10-CM | POA: Diagnosis not present

## 2014-12-17 DIAGNOSIS — J0100 Acute maxillary sinusitis, unspecified: Secondary | ICD-10-CM | POA: Diagnosis not present

## 2015-02-04 DIAGNOSIS — E7800 Pure hypercholesterolemia, unspecified: Secondary | ICD-10-CM | POA: Diagnosis not present

## 2015-02-04 DIAGNOSIS — F329 Major depressive disorder, single episode, unspecified: Secondary | ICD-10-CM | POA: Diagnosis not present

## 2015-02-04 DIAGNOSIS — E1165 Type 2 diabetes mellitus with hyperglycemia: Secondary | ICD-10-CM | POA: Diagnosis not present

## 2015-02-04 DIAGNOSIS — E038 Other specified hypothyroidism: Secondary | ICD-10-CM | POA: Diagnosis not present

## 2015-02-04 DIAGNOSIS — I10 Essential (primary) hypertension: Secondary | ICD-10-CM | POA: Diagnosis not present

## 2015-02-19 DIAGNOSIS — R0789 Other chest pain: Secondary | ICD-10-CM | POA: Diagnosis not present

## 2015-08-10 DIAGNOSIS — E1165 Type 2 diabetes mellitus with hyperglycemia: Secondary | ICD-10-CM | POA: Diagnosis not present

## 2015-08-10 DIAGNOSIS — E7800 Pure hypercholesterolemia, unspecified: Secondary | ICD-10-CM | POA: Diagnosis not present

## 2015-08-10 DIAGNOSIS — Z1239 Encounter for other screening for malignant neoplasm of breast: Secondary | ICD-10-CM | POA: Diagnosis not present

## 2015-08-10 DIAGNOSIS — Z79899 Other long term (current) drug therapy: Secondary | ICD-10-CM | POA: Diagnosis not present

## 2015-08-10 DIAGNOSIS — E038 Other specified hypothyroidism: Secondary | ICD-10-CM | POA: Diagnosis not present

## 2015-08-10 DIAGNOSIS — I10 Essential (primary) hypertension: Secondary | ICD-10-CM | POA: Diagnosis not present

## 2015-09-01 DIAGNOSIS — Z1231 Encounter for screening mammogram for malignant neoplasm of breast: Secondary | ICD-10-CM | POA: Diagnosis not present

## 2015-10-13 DIAGNOSIS — J069 Acute upper respiratory infection, unspecified: Secondary | ICD-10-CM | POA: Diagnosis not present

## 2015-10-13 DIAGNOSIS — E038 Other specified hypothyroidism: Secondary | ICD-10-CM | POA: Diagnosis not present

## 2015-10-13 DIAGNOSIS — Z79899 Other long term (current) drug therapy: Secondary | ICD-10-CM | POA: Diagnosis not present

## 2015-10-13 DIAGNOSIS — E1165 Type 2 diabetes mellitus with hyperglycemia: Secondary | ICD-10-CM | POA: Diagnosis not present

## 2015-10-13 DIAGNOSIS — Z1239 Encounter for other screening for malignant neoplasm of breast: Secondary | ICD-10-CM | POA: Diagnosis not present

## 2015-10-13 DIAGNOSIS — E7800 Pure hypercholesterolemia, unspecified: Secondary | ICD-10-CM | POA: Diagnosis not present

## 2015-10-13 DIAGNOSIS — I10 Essential (primary) hypertension: Secondary | ICD-10-CM | POA: Diagnosis not present

## 2015-10-29 DIAGNOSIS — I10 Essential (primary) hypertension: Secondary | ICD-10-CM | POA: Diagnosis not present

## 2015-10-29 DIAGNOSIS — E038 Other specified hypothyroidism: Secondary | ICD-10-CM | POA: Diagnosis not present

## 2015-10-29 DIAGNOSIS — F329 Major depressive disorder, single episode, unspecified: Secondary | ICD-10-CM | POA: Diagnosis not present

## 2015-10-29 DIAGNOSIS — E1165 Type 2 diabetes mellitus with hyperglycemia: Secondary | ICD-10-CM | POA: Diagnosis not present

## 2015-10-29 DIAGNOSIS — R600 Localized edema: Secondary | ICD-10-CM | POA: Diagnosis not present

## 2015-10-29 DIAGNOSIS — Z0001 Encounter for general adult medical examination with abnormal findings: Secondary | ICD-10-CM | POA: Diagnosis not present

## 2015-10-29 DIAGNOSIS — Z23 Encounter for immunization: Secondary | ICD-10-CM | POA: Diagnosis not present

## 2015-10-29 DIAGNOSIS — E7800 Pure hypercholesterolemia, unspecified: Secondary | ICD-10-CM | POA: Diagnosis not present

## 2016-01-27 DIAGNOSIS — Z79899 Other long term (current) drug therapy: Secondary | ICD-10-CM | POA: Diagnosis not present

## 2016-01-27 DIAGNOSIS — E038 Other specified hypothyroidism: Secondary | ICD-10-CM | POA: Diagnosis not present

## 2016-01-27 DIAGNOSIS — E7800 Pure hypercholesterolemia, unspecified: Secondary | ICD-10-CM | POA: Diagnosis not present

## 2016-01-27 DIAGNOSIS — E1165 Type 2 diabetes mellitus with hyperglycemia: Secondary | ICD-10-CM | POA: Diagnosis not present

## 2016-01-27 DIAGNOSIS — I10 Essential (primary) hypertension: Secondary | ICD-10-CM | POA: Diagnosis not present

## 2016-05-11 DIAGNOSIS — Z79899 Other long term (current) drug therapy: Secondary | ICD-10-CM | POA: Diagnosis not present

## 2016-05-11 DIAGNOSIS — E7800 Pure hypercholesterolemia, unspecified: Secondary | ICD-10-CM | POA: Diagnosis not present

## 2016-05-11 DIAGNOSIS — E038 Other specified hypothyroidism: Secondary | ICD-10-CM | POA: Diagnosis not present

## 2016-05-11 DIAGNOSIS — E1165 Type 2 diabetes mellitus with hyperglycemia: Secondary | ICD-10-CM | POA: Diagnosis not present

## 2016-05-11 DIAGNOSIS — I10 Essential (primary) hypertension: Secondary | ICD-10-CM | POA: Diagnosis not present

## 2016-05-26 DIAGNOSIS — E119 Type 2 diabetes mellitus without complications: Secondary | ICD-10-CM | POA: Diagnosis not present

## 2016-05-26 DIAGNOSIS — Z961 Presence of intraocular lens: Secondary | ICD-10-CM | POA: Diagnosis not present

## 2016-05-26 DIAGNOSIS — H5203 Hypermetropia, bilateral: Secondary | ICD-10-CM | POA: Diagnosis not present

## 2016-06-16 DIAGNOSIS — Z79899 Other long term (current) drug therapy: Secondary | ICD-10-CM | POA: Diagnosis not present

## 2016-06-16 DIAGNOSIS — E1165 Type 2 diabetes mellitus with hyperglycemia: Secondary | ICD-10-CM | POA: Diagnosis not present

## 2016-06-16 DIAGNOSIS — E038 Other specified hypothyroidism: Secondary | ICD-10-CM | POA: Diagnosis not present

## 2016-06-16 DIAGNOSIS — E7800 Pure hypercholesterolemia, unspecified: Secondary | ICD-10-CM | POA: Diagnosis not present

## 2016-08-11 DIAGNOSIS — E7800 Pure hypercholesterolemia, unspecified: Secondary | ICD-10-CM | POA: Diagnosis not present

## 2016-08-11 DIAGNOSIS — Z6836 Body mass index (BMI) 36.0-36.9, adult: Secondary | ICD-10-CM | POA: Diagnosis not present

## 2016-08-11 DIAGNOSIS — E038 Other specified hypothyroidism: Secondary | ICD-10-CM | POA: Diagnosis not present

## 2016-08-11 DIAGNOSIS — E1165 Type 2 diabetes mellitus with hyperglycemia: Secondary | ICD-10-CM | POA: Diagnosis not present

## 2016-08-11 DIAGNOSIS — I10 Essential (primary) hypertension: Secondary | ICD-10-CM | POA: Diagnosis not present

## 2016-10-31 DIAGNOSIS — E1165 Type 2 diabetes mellitus with hyperglycemia: Secondary | ICD-10-CM | POA: Diagnosis not present

## 2016-10-31 DIAGNOSIS — Z79899 Other long term (current) drug therapy: Secondary | ICD-10-CM | POA: Diagnosis not present

## 2016-10-31 DIAGNOSIS — I10 Essential (primary) hypertension: Secondary | ICD-10-CM | POA: Diagnosis not present

## 2016-10-31 DIAGNOSIS — E7800 Pure hypercholesterolemia, unspecified: Secondary | ICD-10-CM | POA: Diagnosis not present

## 2016-10-31 DIAGNOSIS — Z6837 Body mass index (BMI) 37.0-37.9, adult: Secondary | ICD-10-CM | POA: Diagnosis not present

## 2016-10-31 DIAGNOSIS — F325 Major depressive disorder, single episode, in full remission: Secondary | ICD-10-CM | POA: Diagnosis not present

## 2016-10-31 DIAGNOSIS — Z0001 Encounter for general adult medical examination with abnormal findings: Secondary | ICD-10-CM | POA: Diagnosis not present

## 2016-10-31 DIAGNOSIS — E038 Other specified hypothyroidism: Secondary | ICD-10-CM | POA: Diagnosis not present

## 2017-02-24 DIAGNOSIS — E038 Other specified hypothyroidism: Secondary | ICD-10-CM | POA: Diagnosis not present

## 2017-02-24 DIAGNOSIS — I10 Essential (primary) hypertension: Secondary | ICD-10-CM | POA: Diagnosis not present

## 2017-02-24 DIAGNOSIS — E7800 Pure hypercholesterolemia, unspecified: Secondary | ICD-10-CM | POA: Diagnosis not present

## 2017-02-24 DIAGNOSIS — Z79899 Other long term (current) drug therapy: Secondary | ICD-10-CM | POA: Diagnosis not present

## 2017-02-24 DIAGNOSIS — E1165 Type 2 diabetes mellitus with hyperglycemia: Secondary | ICD-10-CM | POA: Diagnosis not present

## 2017-05-25 DIAGNOSIS — E1165 Type 2 diabetes mellitus with hyperglycemia: Secondary | ICD-10-CM | POA: Diagnosis not present

## 2017-05-25 DIAGNOSIS — E038 Other specified hypothyroidism: Secondary | ICD-10-CM | POA: Diagnosis not present

## 2017-05-25 DIAGNOSIS — I10 Essential (primary) hypertension: Secondary | ICD-10-CM | POA: Diagnosis not present

## 2017-05-25 DIAGNOSIS — E7800 Pure hypercholesterolemia, unspecified: Secondary | ICD-10-CM | POA: Diagnosis not present

## 2017-05-25 DIAGNOSIS — E669 Obesity, unspecified: Secondary | ICD-10-CM | POA: Diagnosis not present

## 2017-05-25 DIAGNOSIS — Z79899 Other long term (current) drug therapy: Secondary | ICD-10-CM | POA: Diagnosis not present

## 2017-05-25 DIAGNOSIS — Z6837 Body mass index (BMI) 37.0-37.9, adult: Secondary | ICD-10-CM | POA: Diagnosis not present

## 2017-06-20 DIAGNOSIS — J019 Acute sinusitis, unspecified: Secondary | ICD-10-CM | POA: Diagnosis not present

## 2017-07-17 DIAGNOSIS — J069 Acute upper respiratory infection, unspecified: Secondary | ICD-10-CM | POA: Diagnosis not present

## 2017-07-17 DIAGNOSIS — F329 Major depressive disorder, single episode, unspecified: Secondary | ICD-10-CM | POA: Diagnosis not present

## 2017-08-01 DIAGNOSIS — J0100 Acute maxillary sinusitis, unspecified: Secondary | ICD-10-CM | POA: Diagnosis not present

## 2017-11-13 DIAGNOSIS — N959 Unspecified menopausal and perimenopausal disorder: Secondary | ICD-10-CM | POA: Diagnosis not present

## 2017-11-13 DIAGNOSIS — E1169 Type 2 diabetes mellitus with other specified complication: Secondary | ICD-10-CM | POA: Diagnosis not present

## 2017-11-13 DIAGNOSIS — Z1151 Encounter for screening for human papillomavirus (HPV): Secondary | ICD-10-CM | POA: Diagnosis not present

## 2017-11-13 DIAGNOSIS — Z0000 Encounter for general adult medical examination without abnormal findings: Secondary | ICD-10-CM | POA: Diagnosis not present

## 2017-11-13 DIAGNOSIS — F329 Major depressive disorder, single episode, unspecified: Secondary | ICD-10-CM | POA: Diagnosis not present

## 2017-11-13 DIAGNOSIS — Z79899 Other long term (current) drug therapy: Secondary | ICD-10-CM | POA: Diagnosis not present

## 2017-11-13 DIAGNOSIS — Z124 Encounter for screening for malignant neoplasm of cervix: Secondary | ICD-10-CM | POA: Diagnosis not present

## 2017-11-13 DIAGNOSIS — E038 Other specified hypothyroidism: Secondary | ICD-10-CM | POA: Diagnosis not present

## 2017-11-13 DIAGNOSIS — E7800 Pure hypercholesterolemia, unspecified: Secondary | ICD-10-CM | POA: Diagnosis not present

## 2017-11-13 DIAGNOSIS — I10 Essential (primary) hypertension: Secondary | ICD-10-CM | POA: Diagnosis not present

## 2018-02-12 DIAGNOSIS — M25519 Pain in unspecified shoulder: Secondary | ICD-10-CM | POA: Diagnosis not present

## 2018-02-12 DIAGNOSIS — I10 Essential (primary) hypertension: Secondary | ICD-10-CM | POA: Diagnosis not present

## 2018-02-12 DIAGNOSIS — E7800 Pure hypercholesterolemia, unspecified: Secondary | ICD-10-CM | POA: Diagnosis not present

## 2018-02-12 DIAGNOSIS — E1169 Type 2 diabetes mellitus with other specified complication: Secondary | ICD-10-CM | POA: Diagnosis not present

## 2018-02-12 DIAGNOSIS — E669 Obesity, unspecified: Secondary | ICD-10-CM | POA: Diagnosis not present

## 2018-02-12 DIAGNOSIS — Z6833 Body mass index (BMI) 33.0-33.9, adult: Secondary | ICD-10-CM | POA: Diagnosis not present

## 2018-02-12 DIAGNOSIS — E038 Other specified hypothyroidism: Secondary | ICD-10-CM | POA: Diagnosis not present

## 2018-02-12 DIAGNOSIS — Z2821 Immunization not carried out because of patient refusal: Secondary | ICD-10-CM | POA: Diagnosis not present

## 2018-02-12 DIAGNOSIS — Z79899 Other long term (current) drug therapy: Secondary | ICD-10-CM | POA: Diagnosis not present

## 2018-02-21 DIAGNOSIS — E1169 Type 2 diabetes mellitus with other specified complication: Secondary | ICD-10-CM | POA: Diagnosis not present

## 2018-02-21 DIAGNOSIS — E038 Other specified hypothyroidism: Secondary | ICD-10-CM | POA: Diagnosis not present

## 2018-02-21 DIAGNOSIS — Z79899 Other long term (current) drug therapy: Secondary | ICD-10-CM | POA: Diagnosis not present

## 2018-02-21 DIAGNOSIS — I10 Essential (primary) hypertension: Secondary | ICD-10-CM | POA: Diagnosis not present

## 2018-06-21 DIAGNOSIS — Z86010 Personal history of colonic polyps: Secondary | ICD-10-CM | POA: Diagnosis not present

## 2018-06-21 DIAGNOSIS — E1169 Type 2 diabetes mellitus with other specified complication: Secondary | ICD-10-CM | POA: Diagnosis not present

## 2018-06-21 DIAGNOSIS — E038 Other specified hypothyroidism: Secondary | ICD-10-CM | POA: Diagnosis not present

## 2018-06-21 DIAGNOSIS — I10 Essential (primary) hypertension: Secondary | ICD-10-CM | POA: Diagnosis not present

## 2018-07-02 DIAGNOSIS — Z1231 Encounter for screening mammogram for malignant neoplasm of breast: Secondary | ICD-10-CM | POA: Diagnosis not present

## 2018-07-17 ENCOUNTER — Other Ambulatory Visit: Payer: Self-pay

## 2018-07-17 NOTE — Patient Outreach (Signed)
[place] [facility] [facility]) Care Management  07/17/2018  Sherry Herrera [birth year] [id number]   Medication Adherence call to [name] Compliant Voice message left with a call back number. [name] is showing past due on Atorvastatin 80 mg, Pioglitazone 45 mg and Losartan 100 mg under [place].   [place] Management Direct Dial [phone]  Fax [phone] [email or web address]

## 2018-08-16 ENCOUNTER — Other Ambulatory Visit: Payer: Self-pay

## 2018-08-16 NOTE — Patient Outreach (Signed)
[place] [facility]) Care Management  08/16/2018  Sherry Herrera [birth year] [id number]   Medication Adherence call to [name] Compliant Voice message left with a call back number. [name] is showing past due on Atorvastatin 10 mg and Losartan 100 mg under [place].   [place] Management Direct Dial [phone]  Fax [phone] [email or web address]

## 2018-10-01 DIAGNOSIS — E1169 Type 2 diabetes mellitus with other specified complication: Secondary | ICD-10-CM | POA: Diagnosis not present

## 2018-10-01 DIAGNOSIS — E038 Other specified hypothyroidism: Secondary | ICD-10-CM | POA: Diagnosis not present

## 2018-10-01 DIAGNOSIS — R5382 Chronic fatigue, unspecified: Secondary | ICD-10-CM | POA: Diagnosis not present

## 2018-10-01 DIAGNOSIS — R5381 Other malaise: Secondary | ICD-10-CM | POA: Diagnosis not present

## 2018-10-01 DIAGNOSIS — R531 Weakness: Secondary | ICD-10-CM | POA: Diagnosis not present

## 2018-11-15 DIAGNOSIS — Z0000 Encounter for general adult medical examination without abnormal findings: Secondary | ICD-10-CM | POA: Diagnosis not present

## 2018-11-15 DIAGNOSIS — Z2821 Immunization not carried out because of patient refusal: Secondary | ICD-10-CM | POA: Diagnosis not present

## 2018-11-15 DIAGNOSIS — I10 Essential (primary) hypertension: Secondary | ICD-10-CM | POA: Diagnosis not present

## 2018-11-15 DIAGNOSIS — E7800 Pure hypercholesterolemia, unspecified: Secondary | ICD-10-CM | POA: Diagnosis not present

## 2018-11-15 DIAGNOSIS — E1169 Type 2 diabetes mellitus with other specified complication: Secondary | ICD-10-CM | POA: Diagnosis not present

## 2018-11-15 DIAGNOSIS — Z23 Encounter for immunization: Secondary | ICD-10-CM | POA: Diagnosis not present

## 2018-12-05 ENCOUNTER — Encounter: Payer: Self-pay | Admitting: Gastroenterology

## 2019-02-13 DIAGNOSIS — Z961 Presence of intraocular lens: Secondary | ICD-10-CM | POA: Diagnosis not present

## 2019-02-13 DIAGNOSIS — E119 Type 2 diabetes mellitus without complications: Secondary | ICD-10-CM | POA: Diagnosis not present

## 2019-02-13 DIAGNOSIS — H52203 Unspecified astigmatism, bilateral: Secondary | ICD-10-CM | POA: Diagnosis not present

## 2019-02-18 DIAGNOSIS — I10 Essential (primary) hypertension: Secondary | ICD-10-CM | POA: Diagnosis not present

## 2019-02-18 DIAGNOSIS — E7800 Pure hypercholesterolemia, unspecified: Secondary | ICD-10-CM | POA: Diagnosis not present

## 2019-02-18 DIAGNOSIS — M7501 Adhesive capsulitis of right shoulder: Secondary | ICD-10-CM | POA: Diagnosis not present

## 2019-02-18 DIAGNOSIS — E1169 Type 2 diabetes mellitus with other specified complication: Secondary | ICD-10-CM | POA: Diagnosis not present

## 2019-02-18 DIAGNOSIS — Z79899 Other long term (current) drug therapy: Secondary | ICD-10-CM | POA: Diagnosis not present

## 2019-02-21 DIAGNOSIS — M25511 Pain in right shoulder: Secondary | ICD-10-CM | POA: Diagnosis not present

## 2019-04-16 ENCOUNTER — Encounter: Payer: Self-pay | Admitting: Family Medicine

## 2019-06-13 DIAGNOSIS — J069 Acute upper respiratory infection, unspecified: Secondary | ICD-10-CM | POA: Diagnosis not present

## 2019-07-08 DIAGNOSIS — M19011 Primary osteoarthritis, right shoulder: Secondary | ICD-10-CM | POA: Diagnosis not present

## 2019-09-09 DIAGNOSIS — E1169 Type 2 diabetes mellitus with other specified complication: Secondary | ICD-10-CM | POA: Diagnosis not present

## 2019-09-09 DIAGNOSIS — R609 Edema, unspecified: Secondary | ICD-10-CM | POA: Diagnosis not present

## 2019-09-09 DIAGNOSIS — I10 Essential (primary) hypertension: Secondary | ICD-10-CM | POA: Diagnosis not present

## 2019-09-09 DIAGNOSIS — E7800 Pure hypercholesterolemia, unspecified: Secondary | ICD-10-CM | POA: Diagnosis not present

## 2019-09-09 DIAGNOSIS — E038 Other specified hypothyroidism: Secondary | ICD-10-CM | POA: Diagnosis not present

## 2019-10-22 ENCOUNTER — Ambulatory Visit
Admission: RE | Admit: 2019-10-22 | Discharge: 2019-10-22 | Disposition: A | Discharge status: Home / Self Care | Payer: Medicare Other | Source: Ambulatory Visit

## 2019-10-22 ENCOUNTER — Other Ambulatory Visit: Payer: Self-pay

## 2019-10-22 VITALS — BP 119/77 | HR 63 | Temp 98.8°F | Resp 15

## 2019-10-22 DIAGNOSIS — S50811A Abrasion of right forearm, initial encounter: Secondary | ICD-10-CM

## 2019-10-22 DIAGNOSIS — Z23 Encounter for immunization: Secondary | ICD-10-CM

## 2019-10-22 DIAGNOSIS — S50812A Abrasion of left forearm, initial encounter: Secondary | ICD-10-CM | POA: Diagnosis not present

## 2019-10-22 DIAGNOSIS — S50819A Abrasion of unspecified forearm, initial encounter: Secondary | ICD-10-CM

## 2019-10-22 DIAGNOSIS — L03119 Cellulitis of unspecified part of limb: Secondary | ICD-10-CM

## 2019-10-22 MED ORDER — TETANUS-DIPHTH-ACELL PERTUSSIS 5-2.5-18.5 LF-MCG/0.5 IM SUSP
0.5000 mL | Freq: Once | INTRAMUSCULAR | Status: AC
  Administered 2019-10-22: 0.5 mL via INTRAMUSCULAR

## 2019-10-22 MED ORDER — CEPHALEXIN 500 MG PO CAPS: 500.0000 mg | ORAL_CAPSULE | Freq: Three times a day (TID) | ORAL | 0 refills | Status: AC

## 2019-10-22 NOTE — Discharge Instructions (Signed)
Take the antibiotic as directed.    Keep your wound clean and dry.  Wash it gently twice a day with soap and water.  Apply an antibiotic cream twice a day.    Return here if you see signs of infection, such as increased pain, redness, pus-like drainage, warmth, fever, chills, or other concerning symptoms.    

## 2019-10-22 NOTE — ED Provider Notes (Signed)
Roderic Palau    CSN: [id number] Arrival date & time: 10/22/19  1427      History   Chief Complaint Chief Complaint  Patient presents with  . Abrasion    HPI Sherry Herrera is a 64 y.o. female.   Patient presents with scratches on both of her forearms x1 week.  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  She has been treating them at home hydrogen peroxide and antibiotic ointment.  She denies numbness, weakness, tingling in her hands or arms.  She denies fever, chills, drainage, or other symptoms.  Last tetanus unknown.  The history is provided by the patient.    History reviewed. No pertinent past medical history.  There are no problems to display for this patient.   History reviewed. No pertinent surgical history.  OB History   No obstetric history on file.      Home Medications    Prior to Admission medications   Medication Sig Start Date End Date Taking? Authorizing Provider  aspirin 325 MG tablet Take 325 mg by mouth daily.   Yes [provider]  cephALEXin (KEFLEX) 500 MG capsule Take 1 capsule (500 mg total) by mouth 3 (three) times daily for 10 days. 10/22/19 11/01/19  Sharion Balloon, NP  levothyroxine (SYNTHROID) 88 MCG tablet Take 88 mcg by mouth daily. 07/04/19   [provider]  metoprolol succinate (TOPROL-XL) 50 MG 24 hr tablet Take 50 mg by mouth daily. 10/14/19   [provider]  olmesartan-hydrochlorothiazide (BENICAR HCT) 40-12.5 MG tablet Take 1 tablet by mouth daily. 10/14/19   [provider]  sertraline (ZOLOFT) 100 MG tablet Take 100 mg by mouth daily. 07/30/19   [provider]    Family History History reviewed. No pertinent family history.  Social History Social History   Tobacco Use  . Smoking status: Not on file  Substance Use Topics  . Alcohol use: Not on file  . Drug use: Not on file     Allergies   Lisinopril   Review of Systems Review of Systems    Constitutional: Negative for chills and fever.  HENT: Negative for ear pain and sore throat.   Eyes: Negative for pain and visual disturbance.  Respiratory: Negative for cough and shortness of breath.   Cardiovascular: Negative for chest pain and palpitations.  Gastrointestinal: Negative for abdominal pain and vomiting.  Genitourinary: Negative for dysuria and hematuria.  Musculoskeletal: Negative for arthralgias and back pain.  Skin: Positive for wound. Negative for color change and rash.  Neurological: Negative for seizures, syncope, weakness and numbness.  All other systems reviewed and are negative.    Physical Exam Triage Vital Signs ED Triage Vitals [10/22/19 1518]  Enc Vitals Group     BP      Pulse      Resp      Temp      Temp src      SpO2      Weight      Height      Head Circumference      Peak Flow      Pain Score 0     Pain Loc      Pain Edu?      Excl. in [place]?    No data found.  Updated Vital Signs BP 119/77   Pulse 63   Temp 98.8 F (37.1 C)   Resp 15   SpO2 98%   Visual Acuity  Right Eye Distance:   Left Eye Distance:   Bilateral Distance:    Right Eye Near:   Left Eye Near:    Bilateral Near:     Physical Exam Vitals and nursing note reviewed.  Constitutional:      General: She is not in acute distress.    Appearance: She is well-developed. She is not ill-appearing.  HENT:     Head: Normocephalic and atraumatic.     Mouth/Throat:     Mouth: Mucous membranes are moist.  Eyes:     Conjunctiva/sclera: Conjunctivae normal.  Cardiovascular:     Rate and Rhythm: Normal rate and regular rhythm.     Heart sounds: No murmur heard.   Pulmonary:     Effort: Pulmonary effort is normal. No respiratory distress.     Breath sounds: Normal breath sounds.  Abdominal:     Palpations: Abdomen is soft.     Tenderness: There is no abdominal tenderness.  Musculoskeletal:        General: No swelling or deformity. Normal range of motion.      Cervical back: Neck supple.  Skin:    General: Skin is warm and dry.     Capillary Refill: Capillary refill takes less than 2 seconds.     Findings: Erythema and lesion present.     Comments: Abrasions on both forearms with localized erythema.  See picture for details.  Neurological:     General: No focal deficit present.     Mental Status: She is alert and oriented to person, place, and time.     Sensory: No sensory deficit.     Motor: No weakness.     Gait: Gait normal.  Psychiatric:        Mood and Affect: Mood normal.        Behavior: Behavior normal.        UC Treatments / Results  Labs (all labs ordered are listed, but only abnormal results are displayed) Labs Reviewed - No data to display  EKG   Radiology No results found.  Procedures Procedures (including critical care time)  Medications Ordered in UC Medications  Tdap (BOOSTRIX) injection 0.5 mL (has no administration in time range)    Initial Impression / Assessment and Plan / UC Course  I have reviewed the triage vital signs and the nursing notes.  Pertinent labs & imaging results that were available during my care of the patient were reviewed by me and considered in my medical decision making (see chart for details).   Abrasions and cellulitis of both forearms due to human scratch. Tetanus updated today.  Treating with Keflex.  Wound care instructions and signs of worsening infection discussed with patient.  Instructed her to follow-up with her PCP or return here if she notes signs of worsening infection.  Patient agrees to plan of care.   Final Clinical Impressions(s) / UC Diagnoses   Final diagnoses:  Abrasion of forearm, unspecified laterality, initial encounter  Cellulitis of forearm     Discharge Instructions     Take the antibiotic as directed.    Keep your wound clean and dry.  Wash it gently twice a day with soap and water.  Apply an antibiotic cream twice a day.    Return here if you  see signs of infection, such as increased pain, redness, pus-like drainage, warmth, fever, chills, or other concerning symptoms.       ED Prescriptions    Medication Sig Dispense Auth. Provider   cephALEXin (  KEFLEX) 500 MG capsule Take 1 capsule (500 mg total) by mouth 3 (three) times daily for 10 days. 30 capsule Sharion Balloon, NP     PDMP not reviewed this encounter.   Sharion Balloon, NP 10/22/19 1549

## 2019-10-22 NOTE — ED Triage Notes (Signed)
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 Reports that the client got angry with her and dug her nails into bilateral arms one week ago. Reports she washed it with water, hydrogen peroxide, neosporin and has kept the area covered.

## 2019-11-18 DIAGNOSIS — Z0000 Encounter for general adult medical examination without abnormal findings: Secondary | ICD-10-CM | POA: Diagnosis not present

## 2019-11-18 DIAGNOSIS — I10 Essential (primary) hypertension: Secondary | ICD-10-CM | POA: Diagnosis not present

## 2019-11-18 DIAGNOSIS — E7800 Pure hypercholesterolemia, unspecified: Secondary | ICD-10-CM | POA: Diagnosis not present

## 2019-11-18 DIAGNOSIS — E038 Other specified hypothyroidism: Secondary | ICD-10-CM | POA: Diagnosis not present

## 2019-11-21 ENCOUNTER — Ambulatory Visit: Payer: Medicare Other | Admitting: Cardiology

## 2020-06-09 DIAGNOSIS — I10 Essential (primary) hypertension: Secondary | ICD-10-CM | POA: Diagnosis not present

## 2020-06-09 DIAGNOSIS — E1169 Type 2 diabetes mellitus with other specified complication: Secondary | ICD-10-CM | POA: Diagnosis not present

## 2020-06-09 DIAGNOSIS — E038 Other specified hypothyroidism: Secondary | ICD-10-CM | POA: Diagnosis not present

## 2020-06-09 DIAGNOSIS — E7800 Pure hypercholesterolemia, unspecified: Secondary | ICD-10-CM | POA: Diagnosis not present

## 2020-07-01 DIAGNOSIS — M19011 Primary osteoarthritis, right shoulder: Secondary | ICD-10-CM | POA: Diagnosis not present

## 2020-07-08 DIAGNOSIS — M79609 Pain in unspecified limb: Secondary | ICD-10-CM | POA: Diagnosis not present

## 2020-07-08 DIAGNOSIS — Z01818 Encounter for other preprocedural examination: Secondary | ICD-10-CM | POA: Diagnosis not present

## 2020-07-08 DIAGNOSIS — I517 Cardiomegaly: Secondary | ICD-10-CM | POA: Diagnosis not present

## 2020-07-08 DIAGNOSIS — E559 Vitamin D deficiency, unspecified: Secondary | ICD-10-CM | POA: Diagnosis not present

## 2020-07-08 DIAGNOSIS — Z79899 Other long term (current) drug therapy: Secondary | ICD-10-CM | POA: Diagnosis not present

## 2020-10-15 DIAGNOSIS — I10 Essential (primary) hypertension: Secondary | ICD-10-CM | POA: Diagnosis not present

## 2020-10-15 DIAGNOSIS — R1013 Epigastric pain: Secondary | ICD-10-CM | POA: Diagnosis not present

## 2020-10-15 DIAGNOSIS — Z86010 Personal history of colonic polyps: Secondary | ICD-10-CM | POA: Diagnosis not present

## 2020-10-15 DIAGNOSIS — E7800 Pure hypercholesterolemia, unspecified: Secondary | ICD-10-CM | POA: Diagnosis not present

## 2020-10-15 DIAGNOSIS — E038 Other specified hypothyroidism: Secondary | ICD-10-CM | POA: Diagnosis not present

## 2020-10-15 DIAGNOSIS — L309 Dermatitis, unspecified: Secondary | ICD-10-CM | POA: Diagnosis not present

## 2020-10-15 DIAGNOSIS — Z1231 Encounter for screening mammogram for malignant neoplasm of breast: Secondary | ICD-10-CM | POA: Diagnosis not present

## 2020-10-15 DIAGNOSIS — E1169 Type 2 diabetes mellitus with other specified complication: Secondary | ICD-10-CM | POA: Diagnosis not present

## 2020-10-15 DIAGNOSIS — H1132 Conjunctival hemorrhage, left eye: Secondary | ICD-10-CM | POA: Diagnosis not present

## 2020-10-26 DIAGNOSIS — R1013 Epigastric pain: Secondary | ICD-10-CM | POA: Diagnosis not present

## 2020-10-26 DIAGNOSIS — K8020 Calculus of gallbladder without cholecystitis without obstruction: Secondary | ICD-10-CM | POA: Diagnosis not present

## 2020-10-26 DIAGNOSIS — R110 Nausea: Secondary | ICD-10-CM | POA: Diagnosis not present

## 2020-10-26 DIAGNOSIS — R109 Unspecified abdominal pain: Secondary | ICD-10-CM | POA: Diagnosis not present

## 2020-10-29 DIAGNOSIS — Z1231 Encounter for screening mammogram for malignant neoplasm of breast: Secondary | ICD-10-CM | POA: Diagnosis not present

## 2020-10-30 DIAGNOSIS — R1013 Epigastric pain: Secondary | ICD-10-CM | POA: Diagnosis not present

## 2020-11-14 HISTORY — PX: CHOLECYSTECTOMY: SHX55

## 2020-11-18 DIAGNOSIS — K8010 Calculus of gallbladder with chronic cholecystitis without obstruction: Secondary | ICD-10-CM

## 2020-11-18 HISTORY — DX: Calculus of gallbladder with chronic cholecystitis without obstruction: K80.10

## 2020-11-19 DIAGNOSIS — E7800 Pure hypercholesterolemia, unspecified: Secondary | ICD-10-CM | POA: Diagnosis not present

## 2020-11-19 DIAGNOSIS — Z0000 Encounter for general adult medical examination without abnormal findings: Secondary | ICD-10-CM | POA: Diagnosis not present

## 2020-11-19 DIAGNOSIS — Z2821 Immunization not carried out because of patient refusal: Secondary | ICD-10-CM | POA: Diagnosis not present

## 2020-11-19 DIAGNOSIS — Z01810 Encounter for preprocedural cardiovascular examination: Secondary | ICD-10-CM | POA: Diagnosis not present

## 2020-11-19 DIAGNOSIS — E038 Other specified hypothyroidism: Secondary | ICD-10-CM | POA: Diagnosis not present

## 2020-11-19 DIAGNOSIS — N1832 Chronic kidney disease, stage 3b: Secondary | ICD-10-CM | POA: Diagnosis not present

## 2020-11-19 DIAGNOSIS — I10 Essential (primary) hypertension: Secondary | ICD-10-CM | POA: Diagnosis not present

## 2020-11-19 DIAGNOSIS — E1169 Type 2 diabetes mellitus with other specified complication: Secondary | ICD-10-CM | POA: Diagnosis not present

## 2020-11-24 DIAGNOSIS — J069 Acute upper respiratory infection, unspecified: Secondary | ICD-10-CM | POA: Diagnosis not present

## 2020-11-24 DIAGNOSIS — J0100 Acute maxillary sinusitis, unspecified: Secondary | ICD-10-CM | POA: Diagnosis not present

## 2020-12-08 DIAGNOSIS — K8020 Calculus of gallbladder without cholecystitis without obstruction: Secondary | ICD-10-CM | POA: Diagnosis not present

## 2020-12-08 DIAGNOSIS — K8010 Calculus of gallbladder with chronic cholecystitis without obstruction: Secondary | ICD-10-CM | POA: Diagnosis not present

## 2020-12-08 DIAGNOSIS — E1122 Type 2 diabetes mellitus with diabetic chronic kidney disease: Secondary | ICD-10-CM | POA: Diagnosis not present

## 2020-12-08 DIAGNOSIS — E119 Type 2 diabetes mellitus without complications: Secondary | ICD-10-CM | POA: Diagnosis not present

## 2020-12-08 DIAGNOSIS — I129 Hypertensive chronic kidney disease with stage 1 through stage 4 chronic kidney disease, or unspecified chronic kidney disease: Secondary | ICD-10-CM | POA: Diagnosis not present

## 2020-12-08 DIAGNOSIS — N189 Chronic kidney disease, unspecified: Secondary | ICD-10-CM | POA: Diagnosis not present

## 2020-12-08 DIAGNOSIS — I10 Essential (primary) hypertension: Secondary | ICD-10-CM | POA: Diagnosis not present

## 2020-12-16 DIAGNOSIS — Z09 Encounter for follow-up examination after completed treatment for conditions other than malignant neoplasm: Secondary | ICD-10-CM

## 2020-12-16 HISTORY — DX: Encounter for follow-up examination after completed treatment for conditions other than malignant neoplasm: Z09

## 2020-12-30 DIAGNOSIS — I10 Essential (primary) hypertension: Secondary | ICD-10-CM | POA: Diagnosis not present

## 2021-01-20 DIAGNOSIS — Z01818 Encounter for other preprocedural examination: Secondary | ICD-10-CM

## 2021-01-20 DIAGNOSIS — Z860100 Personal history of colon polyps, unspecified: Secondary | ICD-10-CM

## 2021-01-20 HISTORY — DX: Encounter for other preprocedural examination: Z01.818

## 2021-01-20 HISTORY — DX: Personal history of colon polyps, unspecified: Z86.0100

## 2021-02-03 DIAGNOSIS — M85852 Other specified disorders of bone density and structure, left thigh: Secondary | ICD-10-CM | POA: Diagnosis not present

## 2021-02-03 DIAGNOSIS — E7800 Pure hypercholesterolemia, unspecified: Secondary | ICD-10-CM | POA: Diagnosis not present

## 2021-02-03 DIAGNOSIS — I10 Essential (primary) hypertension: Secondary | ICD-10-CM | POA: Diagnosis not present

## 2021-02-03 DIAGNOSIS — E1169 Type 2 diabetes mellitus with other specified complication: Secondary | ICD-10-CM | POA: Diagnosis not present

## 2021-02-03 DIAGNOSIS — E038 Other specified hypothyroidism: Secondary | ICD-10-CM | POA: Diagnosis not present

## 2021-02-03 DIAGNOSIS — N1832 Chronic kidney disease, stage 3b: Secondary | ICD-10-CM | POA: Diagnosis not present

## 2021-02-03 DIAGNOSIS — M8589 Other specified disorders of bone density and structure, multiple sites: Secondary | ICD-10-CM | POA: Diagnosis not present

## 2021-02-18 DIAGNOSIS — H52203 Unspecified astigmatism, bilateral: Secondary | ICD-10-CM | POA: Diagnosis not present

## 2021-02-18 DIAGNOSIS — E119 Type 2 diabetes mellitus without complications: Secondary | ICD-10-CM | POA: Diagnosis not present

## 2021-02-18 DIAGNOSIS — Z961 Presence of intraocular lens: Secondary | ICD-10-CM | POA: Diagnosis not present

## 2021-02-22 DIAGNOSIS — K5730 Diverticulosis of large intestine without perforation or abscess without bleeding: Secondary | ICD-10-CM | POA: Diagnosis not present

## 2021-02-22 DIAGNOSIS — K635 Polyp of colon: Secondary | ICD-10-CM | POA: Diagnosis not present

## 2021-02-22 DIAGNOSIS — Z86010 Personal history of colonic polyps: Secondary | ICD-10-CM | POA: Diagnosis not present

## 2021-02-22 DIAGNOSIS — Z980 Intestinal bypass and anastomosis status: Secondary | ICD-10-CM | POA: Diagnosis not present

## 2021-02-22 DIAGNOSIS — E119 Type 2 diabetes mellitus without complications: Secondary | ICD-10-CM | POA: Diagnosis not present

## 2021-02-22 DIAGNOSIS — I10 Essential (primary) hypertension: Secondary | ICD-10-CM | POA: Diagnosis not present

## 2021-02-22 DIAGNOSIS — D175 Benign lipomatous neoplasm of intra-abdominal organs: Secondary | ICD-10-CM | POA: Diagnosis not present

## 2021-02-22 DIAGNOSIS — K9189 Other postprocedural complications and disorders of digestive system: Secondary | ICD-10-CM | POA: Diagnosis not present

## 2021-02-22 DIAGNOSIS — Z7984 Long term (current) use of oral hypoglycemic drugs: Secondary | ICD-10-CM | POA: Diagnosis not present

## 2021-02-22 DIAGNOSIS — Z9049 Acquired absence of other specified parts of digestive tract: Secondary | ICD-10-CM | POA: Diagnosis not present

## 2021-02-22 DIAGNOSIS — D122 Benign neoplasm of ascending colon: Secondary | ICD-10-CM | POA: Diagnosis not present

## 2021-02-22 DIAGNOSIS — D1779 Benign lipomatous neoplasm of other sites: Secondary | ICD-10-CM | POA: Diagnosis not present

## 2021-02-22 DIAGNOSIS — Z1211 Encounter for screening for malignant neoplasm of colon: Secondary | ICD-10-CM | POA: Diagnosis not present

## 2021-05-05 DIAGNOSIS — M19011 Primary osteoarthritis, right shoulder: Secondary | ICD-10-CM | POA: Diagnosis not present

## 2021-05-05 DIAGNOSIS — Z98818 Other dental procedure status: Secondary | ICD-10-CM | POA: Diagnosis not present

## 2021-05-05 DIAGNOSIS — I10 Essential (primary) hypertension: Secondary | ICD-10-CM | POA: Diagnosis not present

## 2021-05-05 DIAGNOSIS — E7800 Pure hypercholesterolemia, unspecified: Secondary | ICD-10-CM | POA: Diagnosis not present

## 2021-05-05 DIAGNOSIS — Z01810 Encounter for preprocedural cardiovascular examination: Secondary | ICD-10-CM | POA: Diagnosis not present

## 2021-05-05 DIAGNOSIS — E038 Other specified hypothyroidism: Secondary | ICD-10-CM | POA: Diagnosis not present

## 2021-05-05 DIAGNOSIS — E1169 Type 2 diabetes mellitus with other specified complication: Secondary | ICD-10-CM | POA: Diagnosis not present

## 2021-05-05 DIAGNOSIS — N1832 Chronic kidney disease, stage 3b: Secondary | ICD-10-CM | POA: Diagnosis not present

## 2021-06-02 ENCOUNTER — Encounter: Payer: Self-pay | Admitting: *Deleted

## 2021-06-02 ENCOUNTER — Encounter: Payer: Self-pay | Admitting: Cardiology

## 2021-06-03 ENCOUNTER — Telehealth: Payer: Self-pay

## 2021-06-03 NOTE — Telephone Encounter (Signed)
? ?  Pre-operative Risk Assessment  ?  ?Patient Name: Sherry Herrera  ?DOB: [birth year] ?MRN: [id number]  ? ?  ? ?Request for Surgical Clearance   ? ?Procedure:   right total shoulder replacement  ? ?Date of Surgery:  Clearance TBD                              ?   ?Surgeon:  Dr. Joya Salm ?Surgeon's Group or Practice Name:  [facility] ?Phone number:  [phone] ?Fax number:  [phone] ?  ?Type of Clearance Requested:   ?- Medical  ?  ?Type of Anesthesia:  General  ?  ?Additional requests/questions:   ? ?Signed, ?Laurabeth Yip Tressa Busman   ?06/03/2021, 4:28 PM  ? ?

## 2021-06-04 NOTE — Telephone Encounter (Signed)
Primary Cardiologist:None ? ?Chart reviewed as part of pre-operative protocol coverage. Because of Sherry Herrera past medical history and time since last visit, he/she will require a follow-up visit in order to better assess preoperative cardiovascular risk. ? ?Pre-op covering staff: ?- Please schedule appointment and call patient to inform them. ?- Please contact requesting surgeon's office via preferred method (i.e, phone, fax) to inform them of need for appointment prior to surgery. ? ?If applicable, this message will also be routed to pharmacy pool and/or primary cardiologist for input on holding anticoagulant/antiplatelet agent as requested below so that this information is available at time of patient's appointment.  ? ?Sherry Pelton, NP  ?06/04/2021, 8:02 AM  ? ?

## 2021-06-04 NOTE — Telephone Encounter (Signed)
Pt has appt 06/15/21 with Dr. Agustin Cree. Will forward notes to MD for upcoming appt. Will send FYI to requesting office the pt has appt.  ?

## 2021-06-14 DIAGNOSIS — B029 Zoster without complications: Secondary | ICD-10-CM | POA: Diagnosis not present

## 2021-06-15 ENCOUNTER — Ambulatory Visit: Payer: Medicare Other | Admitting: Cardiology

## 2021-06-15 ENCOUNTER — Encounter: Payer: Self-pay | Admitting: Cardiology

## 2021-06-15 VITALS — BP 154/62 | HR 90 | Ht 64.0 in | Wt 161.2 lb

## 2021-06-15 DIAGNOSIS — Z8673 Personal history of transient ischemic attack (TIA), and cerebral infarction without residual deficits: Secondary | ICD-10-CM

## 2021-06-15 DIAGNOSIS — Z01810 Encounter for preprocedural cardiovascular examination: Secondary | ICD-10-CM | POA: Diagnosis not present

## 2021-06-15 DIAGNOSIS — E785 Hyperlipidemia, unspecified: Secondary | ICD-10-CM

## 2021-06-15 DIAGNOSIS — I10 Essential (primary) hypertension: Secondary | ICD-10-CM

## 2021-06-15 DIAGNOSIS — R0609 Other forms of dyspnea: Secondary | ICD-10-CM

## 2021-06-15 DIAGNOSIS — E118 Type 2 diabetes mellitus with unspecified complications: Secondary | ICD-10-CM

## 2021-06-15 HISTORY — DX: Other forms of dyspnea: R06.09

## 2021-06-15 HISTORY — DX: Hyperlipidemia, unspecified: E78.5

## 2021-06-15 HISTORY — DX: Essential (primary) hypertension: I10

## 2021-06-15 HISTORY — DX: Type 2 diabetes mellitus with unspecified complications: E11.8

## 2021-06-15 HISTORY — DX: Personal history of transient ischemic attack (TIA), and cerebral infarction without residual deficits: Z86.73

## 2021-06-15 NOTE — Progress Notes (Signed)
? ?Cardiology Consultation:   ? ?Date:  06/15/2021  ? ?ID:  Sherry Herrera, DOB Jul 03, 1955, MRN [id number] ? ?PCP:  Greig Right, MD  ?Cardiologist:  Jenne Campus, MD  ? ?Referring MD: Greig Right, MD  ? ?Chief Complaint  ?Patient presents with  ? Medical Clearance  ? ? ?History of Present Illness:   ? ?Sherry Herrera is a 66 y.o. female who is being seen today for the evaluation of need cardiovascular evaluation before elective right shoulder replacement surgery at the request of Greig Right, MD. past medical history significant for TIA that she has suffered from 17 years ago complete recovery, also had essential hypertension, dyslipidemia, diabetes.  She was referred to [place] for evaluation before surgery.  She said that she can walk climb stairs but get tired very easily she also gets short of breath.  She also complained of having balance issues and she has multiple falls to the point that she actually having injury to her face.  She never passed out she is just losing her balance especially when she turns very quickly.  Denies have any chest pain tightness squeezing pressure burning chest, she gets short of breath quite easily she said she can walk from front to the back of [employer] slowly with shopping cart but she gets short of breath and tired. ?She never smoked, does not have family history of premature coronary artery disease.  She does not exercise on the regular basis she is not on any special diet ? ?Past Medical History:  ?Diagnosis Date  ? 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 Chronic renal failure   ? Essential (primary) hypertension   ? GERD (gastroesophageal reflux disease)   ? Hypothyroidism   ? Obesity   ? Osteoarthritis   ? Right Shoulder  ? Pure hypercholesterolemia   ? Transient ischemic attack 2006  ? left arm numbness; never confirmed. CT and MRI negative  ? Type 2 diabetes mellitus ([place])   ? ? ?Past Surgical History:  ?Procedure  Laterality Date  ? CHOLECYSTECTOMY  11/2020  ? COLON RESECTION  08/2010  ? Partial colon resection-transvers colon for tubular adenoma  ? FOOT SURGERY Bilateral   ? Had small benign tumors removed  ? TUBAL LIGATION    ? ? ?Current Medications: ?Current Meds  ?Medication Sig  ? cloNIDine (CATAPRES) 0.1 MG tablet Take 0.1 mg by mouth at bedtime.  ? fenofibrate 160 MG tablet Take 160 mg by mouth daily.  ? levothyroxine (SYNTHROID) 88 MCG tablet Take 88 mcg by mouth daily.  ? olmesartan-hydrochlorothiazide (BENICAR HCT) 40-12.5 MG tablet Take 1 tablet by mouth daily.  ? pioglitazone (ACTOS) 45 MG tablet Take 45 mg by mouth daily.  ? sertraline (ZOLOFT) 100 MG tablet Take 100 mg by mouth daily.  ? [DISCONTINUED] cloNIDine (CATAPRES) 0.1 MG tablet Take 0.1 mg by mouth at bedtime.  ?  ? ?Allergies:   Lisinopril  ? ?Social History  ? ?Socioeconomic History  ? Marital status: Widowed  ?  Spouse name: Not on file  ? Number of children: Not on file  ? Years of education: Not on file  ? Highest education level: Not on file  ?Occupational History  ? Not on file  ?Tobacco Use  ? Smoking status: Never  ? Smokeless tobacco: Never  ?Substance and Sexual Activity  ? Alcohol use: Never  ? Drug use: Not on file  ? Sexual activity: Not on file  ?Other Topics Concern  ? Not on file  ?  Social History Narrative  ? Not on file  ? ?Social Determinants of Health  ? ?Financial Resource Strain: Not on file  ?Food Insecurity: Not on file  ?Transportation Needs: Not on file  ?Physical Activity: Not on file  ?Stress: Not on file  ?Social Connections: Not on file  ?  ? ?Family History: ?The patient's family history includes Diabetes in her mother; Heart attack in her father; Hyperlipidemia in her sister and sister; Hypertension in her brother, mother, sister, and sister; Kidney failure in her mother; Osteoarthritis in her mother; Stroke in her mother. ?ROS:   ?Please see the history of present illness.    ?All 14 point review of systems negative  except as described per history of present illness. ? ?EKGs/Labs/Other Studies Reviewed:   ? ?The following studies were reviewed today: ? ? ?EKG:  EKG is  ordered today.  The ekg ordered today demonstrates normal sinus rhythm left axis deviation incomplete right bundle branch block, left ventricle hypertrophy. ? ?Recent Labs: ?No results found for requested labs within last 8760 hours.  ?Recent Lipid Panel ?   ?Component Value Date/Time  ? CHOL (H) 03/30/2007 0845  ?  234        ?ATP III CLASSIFICATION: ? <200     mg/dL   Desirable ? 200-239  mg/dL   Borderline High ? >=240    mg/dL   High  ? TRIG 171 (H) 03/30/2007 0845  ? HDL 38 (L) 03/30/2007 0845  ? CHOLHDL 6.2 03/30/2007 0845  ? VLDL 34 03/30/2007 0845  ? [place] (H) 03/30/2007 0845  ?  162        ?Total Cholesterol/HDL:CHD Risk ?Coronary Heart Disease Risk Table ?                    Men   Women ? 1/2 Average Risk   3.4   3.3  ? ? ?Physical Exam:   ? ?VS:  BP (!) 154/62 (BP Location: Left Arm, Patient Position: Sitting)   Pulse 90   Ht '5\' 4"'$  (1.626 m)   Wt 161 lb 3.2 oz (73.1 kg)   SpO2 97%   BMI 27.67 kg/m?    ? ?Wt Readings from Last 3 Encounters:  ?06/15/21 161 lb 3.2 oz (73.1 kg)  ?05/05/21 167 lb (75.8 kg)  ?  ? ?GEN:  Well nourished, well developed in no acute distress ?HEENT: Normal ?NECK: No JVD; No carotid bruits ?LYMPHATICS: No lymphadenopathy ?CARDIAC: RRR, no murmurs, no rubs, no gallops ?RESPIRATORY:  Clear to auscultation without rales, wheezing or rhonchi  ?ABDOMEN: Soft, non-tender, non-distended ?MUSCULOSKELETAL:  No edema; No deformity  ?SKIN: Warm and dry ?NEUROLOGIC:  Alert and oriented x 3 ?PSYCHIATRIC:  Normal affect  ? ?ASSESSMENT:   ? ?1. History of TIA (transient ischemic attack)   ?2. Dyspnea on exertion   ?3. Dyslipidemia   ?4. Essential hypertension   ?5. Type 2 diabetes mellitus with complication, without long-term current use of insulin ([place])   ? ?PLAN:   ? ?In order of problems listed above: ? ?Cardiovascular preop  evaluation for this lady with multiple risk factors for coronary artery disease, likely her diabetes quite well controlled, hypertension also seems to be controlled but dyslipidemia is still uncontrolled.  Her ability to exercise is relatively low therefore I cannot rule out possibility of significant coronary artery disease in this lady with multiple risk factors for coronary artery disease.  I will schedule her to have echocardiogram to assess left ventricle ejection  fraction, she will be scheduled also to have Lexiscan to rule out significant ischemia since she does have history of TIA she was told to have some blockages in the cardiac arteries I will ask her to have cardiac ultrasound. ?Dyslipidemia she is taking 80 mg of Lipitor.  I do have her lab work test from her primary care physician and I see that her LDL was 117 September of last year.  Then in March 2023 LDL dropped to 110 prior she was not taking her medication now she side Cardizem to continue. ?Diabetes appears to be well controlled continue present management. ?Essential hypertension slightly elevated today we will continue monitoring ? ? ?Medication Adjustments/Labs and Tests Ordered: ?Current medicines are reviewed at length with the patient today.  Concerns regarding medicines are outlined above.  ?No orders of the defined types were placed in this encounter. ? ?No orders of the defined types were placed in this encounter. ? ? ?Signed, ?Park Liter, MD, [facility]. ?06/15/2021 3:23 PM    ?[place] ? ?

## 2021-06-15 NOTE — Patient Instructions (Signed)
Medication Instructions:  ?Your physician recommends that you continue on your current medications as directed. Please refer to the Current Medication list given to you today.  ?*If you need a refill on your cardiac medications before your next appointment, please call your pharmacy* ? ? ?Lab Work: ?None Ordered ?If you have labs (blood work) drawn today and your tests are completely normal, you will receive your results only by: ?MyChart Message (if you have MyChart) OR ?A paper copy in the mail ?If you have any lab test that is abnormal or we need to change your treatment, we will call you to review the results. ? ? ?Testing/Procedures: ?Your physician has requested that you have a carotid duplex. This test is an ultrasound of the carotid arteries in your neck. It looks at blood flow through these arteries that supply the brain with blood. Allow one hour for this exam. There are no restrictions or special instructions.  ? ?Your physician has requested that you have a lexiscan myoview. For further information please visit [email or web address]. Please follow instruction sheet, as given. ? ?The test will take approximately 3 to 4 hours to complete; you may bring reading material.  If someone comes with you to your appointment, they will need to remain in the main lobby due to limited space in the testing area. *    ? ?How to prepare for your Myocardial Perfusion Test: ?Do not eat or drink 3 hours prior to your test, except you may have water. ?Do not consume products containing caffeine (regular or decaffeinated) 12 hours prior to your test. (ex: coffee, chocolate, sodas, tea). ?Do bring a list of your current medications with you.  If not listed below, you may take your medications as normal. ?Do wear comfortable clothes (no dresses or overalls) and walking shoes, tennis shoes preferred (No heels or open toe shoes are allowed). ?Do NOT wear cologne, perfume, aftershave, or lotions (deodorant is allowed). ?If these  instructions are not followed, your test will have to be rescheduled.   ? ? ? ?Your physician has requested that you have an echocardiogram. Echocardiography is a painless test that uses sound waves to create images of your heart. It provides your doctor with information about the size and shape of your heart and how well your heart?s chambers and valves are working. This procedure takes approximately one hour. There are no restrictions for this procedure.  ? ?Follow-Up: ?At [facility], you and your health needs are our priority.  As part of our continuing mission to provide you with exceptional heart care, we have created designated Provider Care Teams.  These Care Teams include your primary Cardiologist (physician) and Advanced Practice Providers (APPs -  Physician Assistants and Nurse Practitioners) who all work together to provide you with the care you need, when you need it. ? ?We recommend signing up for the patient portal called "MyChart".  Sign up information is provided on this After Visit Summary.  MyChart is used to connect with patients for Virtual Visits (Telemedicine).  Patients are able to view lab/test results, encounter notes, upcoming appointments, etc.  Non-urgent messages can be sent to your provider as well.   ?To learn more about what you can do with MyChart, go to [email or web address].   ? ?Your next appointment:   ?3 month(s) ? ?The format for your next appointment:   ?In Person ? ?Provider:   ?Jenne Campus, MD  ? ? ?Other Instructions ?NA  ?

## 2021-06-20 ENCOUNTER — Emergency Department (HOSPITAL_COMMUNITY): Payer: Medicare Other

## 2021-06-20 ENCOUNTER — Inpatient Hospital Stay (HOSPITAL_COMMUNITY)
Admission: EM | Admit: 2021-06-20 | Discharge: 2021-06-24 | DRG: 641 | Disposition: A | Discharge status: Home / Self Care | Payer: Medicare Other | Attending: Internal Medicine | Admitting: Internal Medicine

## 2021-06-20 ENCOUNTER — Encounter (HOSPITAL_COMMUNITY): Payer: Self-pay | Admitting: Family Medicine

## 2021-06-20 ENCOUNTER — Other Ambulatory Visit: Payer: Self-pay

## 2021-06-20 DIAGNOSIS — Z20822 Contact with and (suspected) exposure to covid-19: Secondary | ICD-10-CM | POA: Diagnosis not present

## 2021-06-20 DIAGNOSIS — D649 Anemia, unspecified: Secondary | ICD-10-CM | POA: Diagnosis present

## 2021-06-20 DIAGNOSIS — E1122 Type 2 diabetes mellitus with diabetic chronic kidney disease: Secondary | ICD-10-CM | POA: Diagnosis present

## 2021-06-20 DIAGNOSIS — E860 Dehydration: Secondary | ICD-10-CM | POA: Diagnosis not present

## 2021-06-20 DIAGNOSIS — N189 Chronic kidney disease, unspecified: Secondary | ICD-10-CM | POA: Diagnosis present

## 2021-06-20 DIAGNOSIS — E118 Type 2 diabetes mellitus with unspecified complications: Secondary | ICD-10-CM | POA: Diagnosis present

## 2021-06-20 DIAGNOSIS — B029 Zoster without complications: Secondary | ICD-10-CM | POA: Diagnosis present

## 2021-06-20 DIAGNOSIS — Z8249 Family history of ischemic heart disease and other diseases of the circulatory system: Secondary | ICD-10-CM

## 2021-06-20 DIAGNOSIS — R778 Other specified abnormalities of plasma proteins: Secondary | ICD-10-CM | POA: Diagnosis not present

## 2021-06-20 DIAGNOSIS — Z79899 Other long term (current) drug therapy: Secondary | ICD-10-CM | POA: Diagnosis not present

## 2021-06-20 DIAGNOSIS — K219 Gastro-esophageal reflux disease without esophagitis: Secondary | ICD-10-CM | POA: Diagnosis present

## 2021-06-20 DIAGNOSIS — Z79890 Hormone replacement therapy (postmenopausal): Secondary | ICD-10-CM

## 2021-06-20 DIAGNOSIS — R404 Transient alteration of awareness: Secondary | ICD-10-CM | POA: Diagnosis not present

## 2021-06-20 DIAGNOSIS — Z9049 Acquired absence of other specified parts of digestive tract: Secondary | ICD-10-CM | POA: Diagnosis not present

## 2021-06-20 DIAGNOSIS — Z833 Family history of diabetes mellitus: Secondary | ICD-10-CM

## 2021-06-20 DIAGNOSIS — R569 Unspecified convulsions: Secondary | ICD-10-CM | POA: Diagnosis not present

## 2021-06-20 DIAGNOSIS — N289 Disorder of kidney and ureter, unspecified: Secondary | ICD-10-CM

## 2021-06-20 DIAGNOSIS — E7800 Pure hypercholesterolemia, unspecified: Secondary | ICD-10-CM | POA: Diagnosis present

## 2021-06-20 DIAGNOSIS — N179 Acute kidney failure, unspecified: Secondary | ICD-10-CM | POA: Diagnosis not present

## 2021-06-20 DIAGNOSIS — E039 Hypothyroidism, unspecified: Secondary | ICD-10-CM | POA: Diagnosis present

## 2021-06-20 DIAGNOSIS — Z96611 Presence of right artificial shoulder joint: Secondary | ICD-10-CM | POA: Diagnosis not present

## 2021-06-20 DIAGNOSIS — Z823 Family history of stroke: Secondary | ICD-10-CM | POA: Diagnosis not present

## 2021-06-20 DIAGNOSIS — I129 Hypertensive chronic kidney disease with stage 1 through stage 4 chronic kidney disease, or unspecified chronic kidney disease: Secondary | ICD-10-CM | POA: Diagnosis present

## 2021-06-20 DIAGNOSIS — Z7982 Long term (current) use of aspirin: Secondary | ICD-10-CM

## 2021-06-20 DIAGNOSIS — R9431 Abnormal electrocardiogram [ECG] [EKG]: Secondary | ICD-10-CM

## 2021-06-20 DIAGNOSIS — E861 Hypovolemia: Secondary | ICD-10-CM | POA: Diagnosis not present

## 2021-06-20 DIAGNOSIS — R55 Syncope and collapse: Principal | ICD-10-CM | POA: Diagnosis present

## 2021-06-20 DIAGNOSIS — I499 Cardiac arrhythmia, unspecified: Secondary | ICD-10-CM | POA: Diagnosis not present

## 2021-06-20 DIAGNOSIS — Z8673 Personal history of transient ischemic attack (TIA), and cerebral infarction without residual deficits: Secondary | ICD-10-CM

## 2021-06-20 DIAGNOSIS — R7989 Other specified abnormal findings of blood chemistry: Secondary | ICD-10-CM

## 2021-06-20 DIAGNOSIS — Z743 Need for continuous supervision: Secondary | ICD-10-CM | POA: Diagnosis not present

## 2021-06-20 DIAGNOSIS — R6889 Other general symptoms and signs: Secondary | ICD-10-CM | POA: Diagnosis not present

## 2021-06-20 HISTORY — DX: Hypomagnesemia: E83.42

## 2021-06-20 HISTORY — DX: Zoster without complications: B02.9

## 2021-06-20 HISTORY — DX: Other specified abnormal findings of blood chemistry: R79.89

## 2021-06-20 HISTORY — DX: Anemia, unspecified: D64.9

## 2021-06-20 HISTORY — DX: Acute kidney failure, unspecified: N17.9

## 2021-06-20 HISTORY — DX: Syncope and collapse: R55

## 2021-06-20 HISTORY — DX: Abnormal electrocardiogram (ECG) (EKG): R94.31

## 2021-06-20 LAB — COMPREHENSIVE METABOLIC PANEL
ALT: 33 U/L (ref 0–44)
AST: 48 U/L — ABNORMAL HIGH (ref 15–41)
Albumin: 3.1 g/dL — ABNORMAL LOW (ref 3.5–5.0)
Alkaline Phosphatase: 32 U/L — ABNORMAL LOW (ref 38–126)
Anion gap: 9 (ref 5–15)
BUN: 40 mg/dL — ABNORMAL HIGH (ref 8–23)
CO2: 18 mmol/L — ABNORMAL LOW (ref 22–32)
Calcium: 9.2 mg/dL (ref 8.9–10.3)
Chloride: 109 mmol/L (ref 98–111)
Creatinine, Ser: 1.99 mg/dL — ABNORMAL HIGH (ref 0.44–1.00)
GFR, Estimated: 27 mL/min — ABNORMAL LOW (ref >60)
Glucose, Bld: 96 mg/dL (ref 70–99)
Potassium: 3.7 mmol/L (ref 3.5–5.1)
Sodium: 136 mmol/L (ref 135–145)
Total Bilirubin: 1.2 mg/dL (ref 0.3–1.2)
Total Protein: 6.1 g/dL — ABNORMAL LOW (ref 6.5–8.1)

## 2021-06-20 LAB — CBC WITH DIFFERENTIAL/PLATELET
Abs Immature Granulocytes: 0.03 10*3/uL (ref 0.00–0.07)
Basophils Absolute: 0 10*3/uL (ref 0.0–0.1)
Basophils Relative: 0 %
Eosinophils Absolute: 0 10*3/uL (ref 0.0–0.5)
Eosinophils Relative: 1 %
HCT: 29.7 % — ABNORMAL LOW (ref 36.0–46.0)
Hemoglobin: 9.8 g/dL — ABNORMAL LOW (ref 12.0–15.0)
Immature Granulocytes: 1 %
Lymphocytes Relative: 23 %
Lymphs Abs: 1.4 10*3/uL (ref 0.7–4.0)
MCH: 29 pg (ref 26.0–34.0)
MCHC: 33 g/dL (ref 30.0–36.0)
MCV: 87.9 fL (ref 80.0–100.0)
Monocytes Absolute: 0.5 10*3/uL (ref 0.1–1.0)
Monocytes Relative: 7 %
Neutro Abs: 4.4 10*3/uL (ref 1.7–7.7)
Neutrophils Relative %: 68 %
Platelets: 219 10*3/uL (ref 150–400)
RBC: 3.38 MIL/uL — ABNORMAL LOW (ref 3.87–5.11)
RDW: 18.8 % — ABNORMAL HIGH (ref 11.5–15.5)
WBC: 6.4 10*3/uL (ref 4.0–10.5)
nRBC: 0 % (ref 0.0–0.2)

## 2021-06-20 LAB — POC OCCULT BLOOD, ED: Fecal Occult Bld: NEGATIVE

## 2021-06-20 LAB — T4, FREE: Free T4: 1.13 ng/dL — ABNORMAL HIGH (ref 0.61–1.12)

## 2021-06-20 LAB — LIPASE, BLOOD: Lipase: 69 U/L — ABNORMAL HIGH (ref 11–51)

## 2021-06-20 LAB — TROPONIN I (HIGH SENSITIVITY)
Troponin I (High Sensitivity): 34 ng/L — ABNORMAL HIGH (ref <18)
Troponin I (High Sensitivity): 30 ng/L — ABNORMAL HIGH (ref <18)

## 2021-06-20 LAB — MAGNESIUM: Magnesium: 1.3 mg/dL — ABNORMAL LOW (ref 1.7–2.4)

## 2021-06-20 LAB — RESP PANEL BY RT-PCR (FLU A&B, COVID) ARPGX2
Influenza A by PCR: NEGATIVE
Influenza B by PCR: NEGATIVE
SARS Coronavirus 2 by RT PCR: NEGATIVE

## 2021-06-20 LAB — TSH: TSH: 1.969 u[IU]/mL (ref 0.350–4.500)

## 2021-06-20 MED ORDER — SENNOSIDES-DOCUSATE SODIUM 8.6-50 MG PO TABS: 1.0000 | ORAL_TABLET | Freq: Every evening | ORAL | Status: DC | PRN

## 2021-06-20 MED ORDER — ACETAMINOPHEN 325 MG PO TABS
650.0000 mg | ORAL_TABLET | Freq: Four times a day (QID) | ORAL | Status: DC | PRN
  Administered 2021-06-21 – 2021-06-24 (×6): 650 mg via ORAL
  Filled 2021-06-20 (×6): qty 2

## 2021-06-20 MED ORDER — SODIUM CHLORIDE 0.9% FLUSH
3.0000 mL | Freq: Two times a day (BID) | INTRAVENOUS | Status: DC
  Administered 2021-06-21 – 2021-06-24 (×4): 3 mL via INTRAVENOUS

## 2021-06-20 MED ORDER — SODIUM CHLORIDE 0.9 % IV BOLUS
1000.0000 mL | Freq: Once | INTRAVENOUS | Status: AC
  Administered 2021-06-20: 1000 mL via INTRAVENOUS

## 2021-06-20 MED ORDER — ACETAMINOPHEN 650 MG RE SUPP: 650.0000 mg | Freq: Four times a day (QID) | RECTAL | Status: DC | PRN

## 2021-06-20 MED ORDER — MAGNESIUM SULFATE 2 GM/50ML IV SOLN
2.0000 g | Freq: Once | INTRAVENOUS | Status: AC
  Administered 2021-06-20: 2 g via INTRAVENOUS
  Filled 2021-06-20: qty 50

## 2021-06-20 MED ORDER — LEVOTHYROXINE SODIUM 88 MCG PO TABS
88.0000 ug | ORAL_TABLET | Freq: Every day | ORAL | Status: DC
  Administered 2021-06-21 – 2021-06-24 (×4): 88 ug via ORAL
  Filled 2021-06-20 (×6): qty 1

## 2021-06-20 MED ORDER — INSULIN ASPART 100 UNIT/ML IJ SOLN: 0.0000 [IU] | Freq: Three times a day (TID) | INTRAMUSCULAR | Status: DC

## 2021-06-20 MED ORDER — MAGNESIUM SULFATE IN D5W 1-5 GM/100ML-% IV SOLN
1.0000 g | Freq: Once | INTRAVENOUS | Status: AC
  Administered 2021-06-20: 1 g via INTRAVENOUS
  Filled 2021-06-20: qty 100

## 2021-06-20 MED ORDER — HEPARIN SODIUM (PORCINE) 5000 UNIT/ML IJ SOLN
5000.0000 [IU] | Freq: Three times a day (TID) | INTRAMUSCULAR | Status: DC
  Administered 2021-06-20 – 2021-06-24 (×10): 5000 [IU] via SUBCUTANEOUS
  Filled 2021-06-20 (×10): qty 1

## 2021-06-20 MED ORDER — ASPIRIN EC 81 MG PO TBEC
81.0000 mg | DELAYED_RELEASE_TABLET | Freq: Every day | ORAL | Status: DC
Start: 2021-06-21 — End: 2021-06-24
  Administered 2021-06-21 – 2021-06-24 (×4): 81 mg via ORAL
  Filled 2021-06-20 (×4): qty 1

## 2021-06-20 MED ORDER — ATORVASTATIN CALCIUM 40 MG PO TABS
80.0000 mg | ORAL_TABLET | Freq: Every day | ORAL | Status: DC
Start: 2021-06-21 — End: 2021-06-21

## 2021-06-20 MED ORDER — AMLODIPINE BESYLATE 5 MG PO TABS
5.0000 mg | ORAL_TABLET | Freq: Every day | ORAL | Status: DC
  Administered 2021-06-21 – 2021-06-24 (×4): 5 mg via ORAL
  Filled 2021-06-20 (×4): qty 1

## 2021-06-20 NOTE — ED Provider Notes (Signed)
Shared visit.  Patient here after syncopal event.  Overall poor historian.  History of diabetes, CKD, hypertension, TIA.  Patient states that she lives with a roommate.  She remembers being in the kitchen area and sitting down and may be feeling like she is going to pass out.  Roommate found her, slumped over in the chair and she had thrown up.  She was diagnosed with shingles last week and has been on antiviral.  Triage note talks about abdominal pain, nausea, vomiting and diarrhea but patient states she only had that one episode of vomiting prior to coming here.  With EMS they thought she had a syncopal event as well.  Not sure if there is a prodrome or not.  She supposed to get some sort of cardiac work-up this week.  Patient appears very pale.  She is often nodding off.  Neurologically she appears to be intact but she looks globally weak.  She looks well older than her stated age.  She states that she has a lot of chronic issues with eating and drinking due to gallbladder issues in the past and has not been the same ever since she had surgery for that.  Overall she looks dehydrated and weak.  This could have been cardiogenic process.  Her EKG does show may be of slightly prolonged QTc.  There are no ischemic changes.  We will give her IV fluid, IV magnesium.  Does not sound like she had seizure episode or postictal state.  We will get labs including head CT, chest x-ray, alcohol level, UDS, CBC, CMP, troponin.  Anticipate that she will need admission given the syncopal events and EKG with prolonged QTc. ?This chart was dictated using voice recognition software.  Despite best efforts to proofread,  errors can occur which can change the documentation meaning.  ?  Lennice Sites, DO ?06/20/21 1945 ? ?

## 2021-06-20 NOTE — H&P (Signed)
?History and Physical  ? ? ?Sherry Herrera TWS:[id number] DOB: 08-08-1955 DOA: 06/20/2021 ? ?PCP: Greig Right, MD  ? ?Patient coming from: Home  ? ?Chief Complaint: Syncope, nausea  ? ?HPI: Sherry Herrera is a 66 y.o. female with medical history significant for type 2 diabetes mellitus, hypertension, hyperlipidemia, history of TIA, and herpes zoster diagnosed 1 week ago, now presenting to the emergency department after syncopal episode.  Patient reports that she has had nausea and poor appetite since developing cholecystitis and undergoing cholecystectomy in October 2022, but states that this has worsened significantly since she started treatment for zoster 1 week ago.  She developed severe nausea earlier today, developed a flushed sensation, diaphoresis, and then vomited and had a transient loss of consciousness.  She was very fatigued after the episode.  She has had similar sensation previously but had not actually lost consciousness until today.  She denies chest pain or palpitations. ? ?ED Course: Upon arrival to the ED, patient is found to be afebrile and saturating well on room air with normal heart rate and stable blood pressure.  EKG features sinus rhythm with LAFB, LVH with repolarization abnormality.  And QTc interval 521 ms.  Chest x-ray notable for cardiomegaly without acute findings.  No acute intracranial abnormality on head CT.  Chemistry panel notable for magnesium 1.3, bicarbonate of 18, BUN 40, and creatinine 1.99.  Troponin was slightly elevated and decreasing.  Lipase elevated to 69.  Patient was given a liter of saline and 2 g IV magnesium in the ED. ? ?Review of Systems:  ?All other systems reviewed and apart from HPI, are negative. ? ?Past Medical History:  ?Diagnosis Date  ? 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 Chronic renal failure   ? Essential (primary) hypertension   ? GERD (gastroesophageal reflux disease)   ? Hypothyroidism   ? Obesity    ? Osteoarthritis   ? Right Shoulder  ? Pure hypercholesterolemia   ? Transient ischemic attack 2006  ? left arm numbness; never confirmed. CT and MRI negative  ? Type 2 diabetes mellitus ([place])   ? ? ?Past Surgical History:  ?Procedure Laterality Date  ? CHOLECYSTECTOMY  11/2020  ? COLON RESECTION  08/2010  ? Partial colon resection-transvers colon for tubular adenoma  ? FOOT SURGERY Bilateral   ? Had small benign tumors removed  ? TUBAL LIGATION    ? ? ?Social History:  ? reports that she has never smoked. She has never used smokeless tobacco. She reports that she does not drink alcohol. No history on file for drug use. ? ?Allergies  ?Allergen Reactions  ? Lisinopril Cough  ? ? ?Family History  ?Problem Relation Age of Onset  ? Kidney failure Mother   ? Osteoarthritis Mother   ? Hypertension Mother   ? Stroke Mother   ? Diabetes Mother   ? Heart attack Father   ? Hypertension Sister   ? Hyperlipidemia Sister   ? Hypertension Sister   ? Hyperlipidemia Sister   ? Hypertension Brother   ? ? ? ?Prior to Admission medications   ?Medication Sig Start Date End Date Taking? Authorizing Provider  ?amLODipine (NORVASC) 5 MG tablet Take 5 mg by mouth daily. 02/02/21   [provider]  ?aspirin 325 MG tablet Take 325 mg by mouth daily.    [provider]  ?atorvastatin (LIPITOR) 80 MG tablet Take 80 mg by mouth daily. 02/01/21   [provider]  ?cloNIDine (CATAPRES) 0.1 MG tablet  Take 0.1 mg by mouth at bedtime.    [provider]  ?fenofibrate 160 MG tablet Take 160 mg by mouth daily. 09/07/20   [provider]  ?levothyroxine (SYNTHROID) 88 MCG tablet Take 88 mcg by mouth daily. 07/04/19   [provider]  ?olmesartan-hydrochlorothiazide (BENICAR HCT) 40-12.5 MG tablet Take 1 tablet by mouth daily. 10/14/19   [provider]  ?pioglitazone (ACTOS) 45 MG tablet Take 45 mg by mouth daily. 09/07/20   [provider]  ?sertraline (ZOLOFT) 100 MG tablet Take 100  mg by mouth daily. 07/30/19   [provider]  ? ? ?Physical Exam: ?Vitals:  ? 06/20/21 2015 06/20/21 2030 06/20/21 2115 06/20/21 2315  ?BP: (!) 155/51 (!) 161/54 (!) 159/46 (!) 160/72  ?Pulse: 67 67 67 66  ?Resp: (!) 25 (!) 23 20 (!) 24  ?Temp:      ?TempSrc:      ?SpO2: 100% 96% 97% 96%  ? ? ?Constitutional: NAD, calm  ?Eyes: PERTLA, lids and conjunctivae normal ?ENMT: Mucous membranes are moist. Posterior pharynx clear of any exudate or lesions.   ?Neck: supple, no masses  ?Respiratory:  no wheezing, no crackles. No accessory muscle use.  ?Cardiovascular: S1 & S2 heard, regular rate and rhythm. Trace pedal edema bilaterally.  ?Abdomen: No distension, soft, no rebound pain or guarding. Bowel sounds active.  ?Musculoskeletal: no clubbing / cyanosis. No joint deformity upper and lower extremities.   ?Skin: Crusted papules near right ear. Warm, dry, well-perfused. ?Neurologic: CN 2-12 grossly intact. Sensation intact. Strength 5/5 in all 4 limbs. Alert and oriented.  ?Psychiatric: Calm. Cooperative.  ? ? ?Labs and Imaging on Admission: I have personally reviewed following labs and imaging studies ? ?CBC: ?Recent Labs  ?Lab 06/20/21 ?1924  ?WBC 6.4  ?NEUTROABS 4.4  ?HGB 9.8*  ?HCT 29.7*  ?MCV 87.9  ?PLT 219  ? ?Basic Metabolic Panel: ?Recent Labs  ?Lab 06/20/21 ?1924  ?NA 136  ?K 3.7  ?CL 109  ?CO2 18*  ?GLUCOSE 96  ?BUN 40*  ?CREATININE 1.99*  ?CALCIUM 9.2  ?MG 1.3*  ? ?GFR: ?Estimated Creatinine Clearance: 26.9 mL/min (A) (by C-G formula based on SCr of 1.99 mg/dL (H)). ?Liver Function Tests: ?Recent Labs  ?Lab 06/20/21 ?1924  ?AST 48*  ?ALT 33  ?ALKPHOS 32*  ?BILITOT 1.2  ?PROT 6.1*  ?ALBUMIN 3.1*  ? ?Recent Labs  ?Lab 06/20/21 ?1924  ?LIPASE 69*  ? ?No results for input(s): AMMONIA in the last 168 hours. ?Coagulation Profile: ?No results for input(s): INR, PROTIME in the last 168 hours. ?Cardiac Enzymes: ?No results for input(s): CKTOTAL, CKMB, CKMBINDEX, TROPONINI in the last 168 hours. ?BNP (last 3  results) ?No results for input(s): PROBNP in the last 8760 hours. ?HbA1C: ?No results for input(s): HGBA1C in the last 72 hours. ?CBG: ?No results for input(s): GLUCAP in the last 168 hours. ?Lipid Profile: ?No results for input(s): CHOL, HDL, LDLCALC, TRIG, CHOLHDL, LDLDIRECT in the last 72 hours. ?Thyroid Function Tests: ?Recent Labs  ?  06/20/21 ?1900 06/20/21 ?1924  ?TSH 1.969  --   ?FREET4  --  1.13*  ? ?Anemia Panel: ?No results for input(s): VITAMINB12, FOLATE, FERRITIN, TIBC, IRON, RETICCTPCT in the last 72 hours. ?Urine analysis: ?   ?Component Value Date/Time  ? [place] YELLOW 03/29/2007 1815  ? APPEARANCEUR CLOUDY (A) 03/29/2007 1815  ? LABSPEC 1.031 (H) 03/29/2007 1815  ? PHURINE 6.0 03/29/2007 1815  ? [place] NEGATIVE 03/29/2007 1815  ? [place] NEGATIVE 03/29/2007 1815  ?  BILIRUBINUR SMALL (A) 03/29/2007 1815  ? KETONESUR 15 (A) 03/29/2007 1815  ? [place] NEGATIVE 03/29/2007 1815  ? UROBILINOGEN 1.0 03/29/2007 1815  ? NITRITE NEGATIVE 03/29/2007 1815  ? LEUKOCYTESUR  03/29/2007 1815  ?  NEGATIVE MICROSCOPIC NOT DONE ON URINES WITH NEGATIVE PROTEIN, BLOOD, LEUKOCYTES, NITRITE, OR GLUCOSE <1000 mg/dL.  ? ?Sepsis Labs: ?'@LABRCNTIP'$ (procalcitonin:4,lacticidven:4) ?) ?Recent Results (from the past 240 hour(s))  ?Resp Panel by RT-PCR (Flu A&B, Covid) Nasopharyngeal Swab     Status: None  ? Collection Time: 06/20/21  7:24 PM  ? Specimen: Nasopharyngeal Swab; Nasopharyngeal(NP) swabs in vial transport medium  ?Result Value Ref Range Status  ? SARS Coronavirus 2 by RT PCR NEGATIVE NEGATIVE Final  ?  Comment: (NOTE) ?SARS-CoV-2 target nucleic acids are NOT DETECTED. ? ?The SARS-CoV-2 RNA is generally detectable in upper respiratory ?specimens during the acute phase of infection. The lowest ?concentration of SARS-CoV-2 viral copies this assay can detect is ?138 copies/mL. A negative result does not preclude SARS-Cov-2 ?infection and should not be used as the sole basis for treatment or ?other patient management  decisions. A negative result may occur with  ?improper specimen collection/handling, submission of specimen other ?than nasopharyngeal swab, presence of viral mutation(s) within the ?areas targeted by [facility]

## 2021-06-20 NOTE — ED Provider Notes (Signed)
?[facility] ?Provider Note ? ? ?CSN: [id number] ?Arrival date & time: 06/20/21  1814 ? ?  ? ?History ? ?Chief Complaint  ?Patient presents with  ? Loss of Consciousness  ? ? ?Sherry Herrera is a 66 y.o. female with history of type 2 diabetes, chronic kidney disease, TIA 17 years ago, hypertension, dyslipidemia and cholecystectomy brought in by EMS for evaluation of 2 syncopal episodes, one witnessed by EMS. Pt states that she was in her kitchen when she felt lightheaded and sat down on a chair. She thinks that she passed out at this point and EMS was called.  Per EMS report, they found patient appearing clammy, diaphoretic and altered and witnessed the second syncopal episode prior to bring her to the emergency department.  On exam, patient does not remember if she was sitting or standing when she passed out but does not remember much of the event itself. When asked if she has abdominal pain, she states that she has had abdominal pain and nausea since her cholecystectomy done in October.  She states that she feels hungry all the time, but after taking a bite of food she feels sick to her stomach and does not eat.  Denies any chest pain, difficulty breathing or shortness of breath.  She is a non-smoker.  Of note, patient was recently diagnosed with shingles and she is only taken 1 tablet of the antiviral as she said she did not enjoy the taste of the pill. ? ?On chart review, patient was seen by cardiology for first visit on 06/15/2021 for evaluation in order for patient to have elective right shoulder replacement surgery.  This was requested by her PCP, Dr. Burnett Sheng as patient has been complaining of balance issues and becoming short of breath with walking around her house.  Patient was scheduled to have echocardiogram, Lexiscan and cardiac ultrasound to assess for blockages given her history of TIA.  Patient states that this appointment was scheduled for 06/23/2021. ? ? ?Loss of  Consciousness ? ?  ? ?Home Medications ?Prior to Admission medications   ?Medication Sig Start Date End Date Taking? Authorizing Provider  ?amLODipine (NORVASC) 5 MG tablet Take 5 mg by mouth daily. 02/02/21   [provider]  ?aspirin 325 MG tablet Take 325 mg by mouth daily.    [provider]  ?atorvastatin (LIPITOR) 80 MG tablet Take 80 mg by mouth daily. 02/01/21   [provider]  ?cloNIDine (CATAPRES) 0.1 MG tablet Take 0.1 mg by mouth at bedtime.    [provider]  ?fenofibrate 160 MG tablet Take 160 mg by mouth daily. 09/07/20   [provider]  ?levothyroxine (SYNTHROID) 88 MCG tablet Take 88 mcg by mouth daily. 07/04/19   [provider]  ?olmesartan-hydrochlorothiazide (BENICAR HCT) 40-12.5 MG tablet Take 1 tablet by mouth daily. 10/14/19   [provider]  ?pioglitazone (ACTOS) 45 MG tablet Take 45 mg by mouth daily. 09/07/20   [provider]  ?sertraline (ZOLOFT) 100 MG tablet Take 100 mg by mouth daily. 07/30/19   [provider]  ?   ? ?Allergies    ?Lisinopril   ? ?Review of Systems   ?Review of Systems  ?Cardiovascular:  Positive for syncope.  ? ?Physical Exam ?Updated Vital Signs ?BP (!) 159/46   Pulse 67   Temp (!) 97.5 ?F (36.4 ?C) (Oral)   Resp 20   SpO2 97%  ?Physical Exam ?Vitals and nursing note reviewed.  ?Constitutional:   ?  General: She is not in acute distress. ?   Appearance: She is not ill-appearing.  ?   Comments: Patient is drowsy, eyes remain closed majority of exam while speaking.  Very pale  ?HENT:  ?   Head: Atraumatic.  ?   Ears:  ?   Comments: Bilateral ears without vesicles or infection ?Eyes:  ?   Conjunctiva/sclera: Conjunctivae normal.  ?   Pupils: Pupils are equal, round, and reactive to light.  ?Cardiovascular:  ?   Rate and Rhythm: Normal rate and regular rhythm.  ?   Pulses: Normal pulses.  ?   Heart sounds: No murmur heard. ?Pulmonary:  ?   Effort: Pulmonary effort is normal. No  respiratory distress.  ?   Breath sounds: Normal breath sounds.  ?Abdominal:  ?   General: Abdomen is flat. There is no distension.  ?   Palpations: Abdomen is soft.  ?   Tenderness: There is abdominal tenderness.  ?   Comments: TTP around umbilical region. Neg McBurney. Neg suprapubic tenderness  ?Musculoskeletal:     ?   General: Normal range of motion.  ?   Cervical back: Normal range of motion.  ?Skin: ?   General: Skin is warm and dry.  ?   Capillary Refill: Capillary refill takes less than 2 seconds.  ?Neurological:  ?   General: No focal deficit present.  ?   Comments: Speech is clear, able to follow commands ?Symmetric strength in upper and lower extremities bilaterally including dorsiflexion and plantar flexion, globally, strength is diminished ?Subjective sensation intact and symmetric bilaterally ?Moves extremities without ataxia, coordination intact ?Normal finger to nose and rapid alternating movements ? ? ?  ?Psychiatric:     ?   Mood and Affect: Mood normal.  ? ? ?ED Results / Procedures / Treatments   ?Labs ?(all labs ordered are listed, but only abnormal results are displayed) ?Labs Reviewed  ?CBC WITH DIFFERENTIAL/PLATELET - Abnormal; Notable for the following components:  ?    Result Value  ? RBC 3.38 (*)   ? Hemoglobin 9.8 (*)   ? HCT 29.7 (*)   ? RDW 18.8 (*)   ? All other components within normal limits  ?COMPREHENSIVE METABOLIC PANEL - Abnormal; Notable for the following components:  ? CO2 18 (*)   ? BUN 40 (*)   ? Creatinine, Ser 1.99 (*)   ? Total Protein 6.1 (*)   ? Albumin 3.1 (*)   ? AST 48 (*)   ? Alkaline Phosphatase 32 (*)   ? GFR, Estimated 27 (*)   ? All other components within normal limits  ?LIPASE, BLOOD - Abnormal; Notable for the following components:  ? Lipase 69 (*)   ? All other components within normal limits  ?MAGNESIUM - Abnormal; Notable for the following components:  ? Magnesium 1.3 (*)   ? All other components within normal limits  ?T4, FREE - Abnormal; Notable for the  following components:  ? Free T4 1.13 (*)   ? All other components within normal limits  ?TROPONIN I (HIGH SENSITIVITY) - Abnormal; Notable for the following components:  ? Troponin I (High Sensitivity) 34 (*)   ? All other components within normal limits  ?RESP PANEL BY RT-PCR (FLU A&B, COVID) [id number]  ?TSH  ?POC OCCULT BLOOD, ED  ?TROPONIN I (HIGH SENSITIVITY)  ? ? ?EKG ?EKG Interpretation ? ?Date/Time:  Sunday Jun 20 2021 18:20:04 EDT ?Ventricular Rate:  62 ?PR Interval:  172 ?QRS Duration: 96 ?QT Interval:  513 ?QTC Calculation: 521 ?R Axis:   -58 ?Text Interpretation: Sinus rhythm Left anterior fascicular block LVH with secondary repolarization abnormality Prolonged QT interval Confirmed by Lennice Sites ([id number]) on 06/20/2021 6:30:09 PM ? ?Radiology ?DG Chest 1 View ? ?Result Date: 06/20/2021 ?CLINICAL DATA:  Syncope EXAM: CHEST  1 VIEW COMPARISON:  07/08/2020 and prior radiographs FINDINGS: Cardiomegaly again noted. RIGHT hemidiaphragm elevation is again identified. There is no evidence of focal airspace disease, pulmonary edema, suspicious pulmonary nodule/mass, pleural effusion, or pneumothorax. No acute bony abnormalities are identified. IMPRESSION: Cardiomegaly without evidence of acute cardiopulmonary disease. Electronically Signed   By: Margarette Canada M.D.   On: 06/20/2021 19:14  ? ?CT Head Wo Contrast ? ?Result Date: 06/20/2021 ?CLINICAL DATA:  Syncope. EXAM: CT HEAD WITHOUT CONTRAST TECHNIQUE: Contiguous axial images were obtained from the base of the skull through the vertex without intravenous contrast. RADIATION DOSE REDUCTION: This exam was performed according to the departmental dose-optimization program which includes automated exposure control, adjustment of the mA and/or kV according to patient size and/or use of iterative reconstruction technique. COMPARISON:  03/29/2007 CT FINDINGS: Brain: No evidence of acute infarction, hemorrhage, hydrocephalus, extra-axial collection or mass lesion/mass effect.  Atrophy and chronic small-vessel white matter ischemic changes again noted. Vascular: Carotid and vertebral atherosclerotic calcifications are noted. Skull: No acute abnormality. Sinuses/Orbits: No acute abnormality Ot

## 2021-06-20 NOTE — ED Notes (Signed)
Patient transported to CT 

## 2021-06-20 NOTE — ED Triage Notes (Signed)
Syncopal episode while at kitchen area. EMS reports pale, clammy and diaphoretic. 2nd syncopal episode witnessed by EMS. Shingles dx last week. Also reports abdominal pain, nausea, vomiting and diarrhea. Alert and oriented x 4.  ? ?CBG 141 ?Bp 122/76 ?HR 60 ?

## 2021-06-20 NOTE — ED Notes (Signed)
Admit provider at bedside 

## 2021-06-21 ENCOUNTER — Observation Stay (HOSPITAL_BASED_OUTPATIENT_CLINIC_OR_DEPARTMENT_OTHER): Payer: Medicare Other

## 2021-06-21 DIAGNOSIS — R55 Syncope and collapse: Secondary | ICD-10-CM

## 2021-06-21 LAB — URINALYSIS, COMPLETE (UACMP) WITH MICROSCOPIC
Bilirubin Urine: NEGATIVE
Glucose, UA: NEGATIVE mg/dL
Hgb urine dipstick: NEGATIVE
Ketones, ur: NEGATIVE mg/dL
Nitrite: NEGATIVE
Protein, ur: NEGATIVE mg/dL
Specific Gravity, Urine: 1.014 (ref 1.005–1.030)
pH: 5 (ref 5.0–8.0)

## 2021-06-21 LAB — ECHOCARDIOGRAM COMPLETE
AR max vel: 2.01 cm2
AV Area VTI: 2.04 cm2
AV Area mean vel: 2.06 cm2
AV Mean grad: 6 mmHg
AV Peak grad: 10.5 mmHg
Ao pk vel: 1.62 m/s
Area-P 1/2: 2.8 cm2
MV VTI: 1.72 cm2
S' Lateral: 3.2 cm
Single Plane A4C EF: 61.6 %

## 2021-06-21 LAB — COMPREHENSIVE METABOLIC PANEL
ALT: 33 U/L (ref 0–44)
AST: 47 U/L — ABNORMAL HIGH (ref 15–41)
Albumin: 3.1 g/dL — ABNORMAL LOW (ref 3.5–5.0)
Alkaline Phosphatase: 29 U/L — ABNORMAL LOW (ref 38–126)
Anion gap: 4 — ABNORMAL LOW (ref 5–15)
BUN: 34 mg/dL — ABNORMAL HIGH (ref 8–23)
CO2: 21 mmol/L — ABNORMAL LOW (ref 22–32)
Calcium: 9.1 mg/dL (ref 8.9–10.3)
Chloride: 111 mmol/L (ref 98–111)
Creatinine, Ser: 1.6 mg/dL — ABNORMAL HIGH (ref 0.44–1.00)
GFR, Estimated: 35 mL/min — ABNORMAL LOW (ref >60)
Glucose, Bld: 116 mg/dL — ABNORMAL HIGH (ref 70–99)
Potassium: 3.9 mmol/L (ref 3.5–5.1)
Sodium: 136 mmol/L (ref 135–145)
Total Bilirubin: 0.8 mg/dL (ref 0.3–1.2)
Total Protein: 6.3 g/dL — ABNORMAL LOW (ref 6.5–8.1)

## 2021-06-21 LAB — CBG MONITORING, ED
Glucose-Capillary: 92 mg/dL (ref 70–99)
Glucose-Capillary: 91 mg/dL (ref 70–99)
Glucose-Capillary: 102 mg/dL — ABNORMAL HIGH (ref 70–99)

## 2021-06-21 LAB — CBC
HCT: 28.5 % — ABNORMAL LOW (ref 36.0–46.0)
Hemoglobin: 9.8 g/dL — ABNORMAL LOW (ref 12.0–15.0)
MCH: 28.8 pg (ref 26.0–34.0)
MCHC: 34.4 g/dL (ref 30.0–36.0)
MCV: 83.8 fL (ref 80.0–100.0)
Platelets: 258 10*3/uL (ref 150–400)
RBC: 3.4 MIL/uL — ABNORMAL LOW (ref 3.87–5.11)
RDW: 18.7 % — ABNORMAL HIGH (ref 11.5–15.5)
WBC: 6 10*3/uL (ref 4.0–10.5)
nRBC: 0.3 % — ABNORMAL HIGH (ref 0.0–0.2)

## 2021-06-21 LAB — HIV ANTIBODY (ROUTINE TESTING W REFLEX): HIV Screen 4th Generation wRfx: NONREACTIVE

## 2021-06-21 LAB — HEMOGLOBIN A1C
Hgb A1c MFr Bld: 5.6 % (ref 4.8–5.6)
Mean Plasma Glucose: 114.02 mg/dL

## 2021-06-21 LAB — GLUCOSE, CAPILLARY: Glucose-Capillary: 94 mg/dL (ref 70–99)

## 2021-06-21 LAB — MAGNESIUM: Magnesium: 2.3 mg/dL (ref 1.7–2.4)

## 2021-06-21 LAB — SODIUM, URINE, RANDOM: Sodium, Ur: 108 mmol/L

## 2021-06-21 LAB — CREATININE, URINE, RANDOM: Creatinine, Urine: 83.99 mg/dL

## 2021-06-21 MED ORDER — VALACYCLOVIR HCL 500 MG PO TABS
1000.0000 mg | ORAL_TABLET | Freq: Two times a day (BID) | ORAL | Status: AC
  Administered 2021-06-21 (×2): 1000 mg via ORAL
  Filled 2021-06-21 (×3): qty 2

## 2021-06-21 MED ORDER — SODIUM CHLORIDE 0.9 % IV SOLN: INTRAVENOUS | Status: DC

## 2021-06-21 MED ORDER — CLONIDINE HCL 0.1 MG PO TABS
0.1000 mg | ORAL_TABLET | Freq: Every day | ORAL | Status: DC
  Administered 2021-06-21 – 2021-06-24 (×4): 0.1 mg via ORAL
  Filled 2021-06-21 (×4): qty 1

## 2021-06-21 MED ORDER — CLONIDINE HCL 0.1 MG PO TABS: 0.1000 mg | ORAL_TABLET | Freq: Every day | ORAL | Status: DC

## 2021-06-21 MED ORDER — ATORVASTATIN CALCIUM 80 MG PO TABS
80.0000 mg | ORAL_TABLET | Freq: Every day | ORAL | Status: DC
  Administered 2021-06-21 – 2021-06-23 (×3): 80 mg via ORAL
  Filled 2021-06-21 (×3): qty 1

## 2021-06-21 MED ORDER — HYDRALAZINE HCL 20 MG/ML IJ SOLN
10.0000 mg | Freq: Four times a day (QID) | INTRAMUSCULAR | Status: DC | PRN
  Administered 2021-06-21 – 2021-06-24 (×5): 10 mg via INTRAVENOUS
  Filled 2021-06-21 (×5): qty 1

## 2021-06-21 NOTE — Progress Notes (Signed)
?PROGRESS NOTE ? ?Sherry Herrera  ?DOB: 01-10-1956  ?PCP: Greig Right, MD ?XAJ:[id number]  ?DOA: 06/20/2021 ? LOS: 0 days  ?Hospital Day: 2 ? ?Brief narrative: ?Sherry Herrera is a 66 y.o. female with PMH significant for DM2, HTN, HLD, TIA, CKD, GERD, cholelithiasis, hypothyroidism, osteoarthritis was diagnosed with herpes zoster a week ago. ?Patient was brought to the ED from home for syncopal episode.   Earlier in the day of presentation, she had severe nausea, felt lightheaded, diaphoretic, vomited once and passed out transiently.Marland Kitchen ?EMS noted her pale, clammy and diaphoretic.  Second syncopal episode witnessed by EMS.  Patient also reported abdominal pain, nausea, vomiting and diarrhea. ? ?Patient reportedly has nausea, poor appetite since developing cholecystitis and underwent cholecystectomy October 2022.  Last week, she was diagnosed with herpes zoster and was started on treatment.  Her GI symptoms have worsened since then.  ? ?In the ED, patient was found to be afebrile, blood pressure was 130/46 and later on increased up to 180s. ?BMP with BUN/creatinine 40/1.99, magnesium low at 1.3. ?EKG showed LAFB, LVH with repolarization abnormality.  QTc interval 521 ms.  ?Chest x-ray notable for cardiomegaly without acute findings.   ?CT head did not show any acute intracranial normality  ?Patient was given 1 liter of saline and 2 g IV magnesium. ?Kept on Observation to Hospitalist Service ? ?Subjective: ?Patient was seen and examined this morning.  Pleasant elderly Caucasian female. Waiting for floor bed in ED.  ?Family not at bedside.  Patient still feels sick and does not want to eat ?Chart reviewed ?Blood pressure has been running over 170 overnight ?Labs from this morning with creatinine improving, 1.6, hemoglobin stable ? ?Principal Problem: ?  Syncope ?Active Problems: ?  History of TIA (transient ischemic attack) ?  Type 2 diabetes mellitus with complication, without long-term current use of insulin ([place]) ?   AKI (acute kidney injury) ([place]) ?  Normocytic anemia ?  Prolonged QT interval ?  Hypomagnesemia ?  Elevated troponin ?  Zoster ?  ? ?Assessment and Plan: ?Syncope  ?-Patient presented with severe nausea, 1 episode of vomiting, diaphoresis and 2 episodes of syncope.  ?-History likely suggestive of vasovagal/hypovolemic syncope.  ?-Pending echocardiogram. ?-Continue to monitor in telemetry ?-Continue to monitor vitals. ? ?Acute on chronic nausea/vomiting ?-Patient reports some nausea and decreased appetite since cholecystectomy in October 2022 but has worsened acutely since starting treatment for Zoster a week ago  ?- Exam is benign.  Complete the course of antiviral treatment. ?  ?Recent herpes zoster  ?-Patient recently had herpes zoster in the right side of her scalp.  They appear crusted and resolving. ? ?AKI ?-Presented with creatinine elevated to 1.9, unclear baseline.  Creatinine elevation likely because of fluid loss.  Improving with hydration. ?Recent Labs  ?  06/20/21 ?1924 06/21/21 ?0228  ?BUN 40* 34*  ?CREATININE 1.99* 1.60*  ? ?Essential hypertension ?-Blood pressure elevated to 170s and 180s overnight ?-PTA, patient was on amlodipine 5 mg daily, clonidine 0.1 mg daily, olmesartan 40 mg daily, HCTZ 12.5 mg daily, ?-On admission, patient was ordered for amlodipine only.  Resumed clonidine this morning.  Because of AKI, we will continue to hold olmesartan and HCTZ. ? ?Type 2 diabetes mellitus ?-A1c 5.6 from May 2022 ?-Home meds include Actos 45 mg daily ?-Currently on sliding scale insulin with Accu-Cheks ?Recent Labs  ?Lab 06/21/21 ?0750 06/21/21 ?1204  ?GLUCAP 92 91  ? ? ?Elevated troponin  ?-Troponin slightly elevated, is decreasing, and without angina  ?-Low-suspicion for  ACS, echo is being obtained for syncope workup and wall-motion can be assessed   ?Recent Labs  ?  06/20/21 ?1924 06/20/21 ?2201  ?TROPONINIHS 34* 30*  ?  ?Hx of TIA  ?Hyperlipidemia ?-Continue ASA, statin and fenofibrate. ?  ?Prolonged  QT interval  ?Hypomagnesemia ?-QTc is 521 ms on admission in setting of hypomagnesemia  ?-Magnesium level replaced. ?Recent Labs  ?Lab 06/20/21 ?1924 06/21/21 ?0228  ?K 3.7 3.9  ?MG 1.3* 2.3  ? ?Hypothyroidism ?-Continue Synthroid ?  ?Anemia  ?-Hgb is 9.8 on admission, down from 13.2 in 2009  ?-Pt denies bleeding and FOBT is negative in ED  ?-Obtain anemia panel. ? ?Recent Labs  ?  06/20/21 ?1924 06/21/21 ?0228  ?HGB 9.8* 9.8*  ?MCV 87.9 83.8  ? ?Goals of care ?  Code Status: Full Code  ? ? ?Mobility: Encourage ambulation.  PT eval ? ?Skin assessment:  ?  ? ?Nutritional status:  ?There is no height or weight on file to calculate BMI.  ?  ?  ? ? ? ? ?Diet:  ?Diet Order   ? ?       ?  Diet heart healthy/carb modified Room service appropriate? Yes; Fluid consistency: Thin  Diet effective now       ?  ? ?  ?  ? ?  ? ? ?DVT prophylaxis:  ?heparin injection 5,000 Units Start: 06/20/21 2215 ?  ?Antimicrobials: None ?Fluid: NS at 75 mill per hour ?Consultants: None ?Family Communication: None at bedside ? ?Status is: Observation ? ?Continue in-hospital care because: Continued IV fluid.  Continues to have nausea, poor oral intake ?Level of care: Telemetry Medical  ? ?Dispo: The patient is from: Home ?             Anticipated d/c is to: Pending PT eval, pending clinical course ?             Patient currently is not medically stable to d/c. ?  Difficult to place patient No ? ? ? ? ?Infusions:  ? sodium chloride 75 mL/hr at 06/21/21 3009  ? ? ?Scheduled Meds: ? amLODipine  5 mg Oral Daily  ? aspirin EC  81 mg Oral Daily  ? atorvastatin  80 mg Oral Daily  ? cloNIDine  0.1 mg Oral Daily  ? heparin  5,000 Units Subcutaneous Q8H  ? insulin aspart  0-6 Units Subcutaneous TID WC  ? levothyroxine  88 mcg Oral Daily  ? sodium chloride flush  3 mL Intravenous Q12H  ? ? ?PRN meds: ?acetaminophen **OR** acetaminophen, senna-docusate  ? ?Antimicrobials: ?Anti-infectives (From admission, onward)  ? ? None  ? ?  ? ? ?Objective: ?Vitals:  ?  06/21/21 1245 06/21/21 1300  ?BP: (!) 166/53 (!) 164/53  ?Pulse: 62 (!) 59  ?Resp:  13  ?Temp:    ?SpO2: 100% 100%  ? ? ?Intake/Output Summary (Last 24 hours) at 06/21/2021 1347 ?Last data filed at 06/21/2021 0037 ?Gross per 24 hour  ?Intake 1144.62 ml  ?Output --  ?Net 1144.62 ml  ? ?There were no vitals filed for this visit. ?Weight change:  ?There is no height or weight on file to calculate BMI.  ? ?Physical Exam: ?General exam: Pleasant, elderly Caucasian female with not in visible distress.  Feels poor ?Skin: No rashes, lesions or ulcers. ?HEENT: Atraumatic, normocephalic, no obvious bleeding ?Lungs: Clear to auscultation bilaterally ?CVS: Regular rate and rhythm, no murmur ?GI/Abd soft, nontender, nondistended, bowel sound present ?CNS: Opens eyes on verbal command, oriented x3 ?Psychiatry:  Sad affect ?Extremities: No pedal edema, no calf tenderness ? ?Data Review: I have personally reviewed the laboratory data and studies available. ? ?F/u labs ordered ?Unresulted Labs (From admission, onward)  ? ?  Start     Ordered  ? 06/22/21 0500  Vitamin B12  (Anemia Panel ([facility]))  Tomorrow morning,   R       ? 06/21/21 0851  ? 06/22/21 0500  Folate  (Anemia Panel ([facility]))  Tomorrow morning,   R       ? 06/21/21 0851  ? 06/22/21 0500  Iron and TIBC  (Anemia Panel ([facility]))  Tomorrow morning,   R       ? 06/21/21 0851  ? 06/22/21 0500  Ferritin  (Anemia Panel ([facility]))  Tomorrow morning,   R       ? 06/21/21 0851  ? 06/22/21 0500  Reticulocytes  (Anemia Panel ([facility]))  Tomorrow morning,   R       ? 06/21/21 0851  ? 06/22/21 0500  CBC with Differential/Platelet  Tomorrow morning,   R       ? 06/21/21 0932  ? 06/22/21 6834  Basic metabolic panel  Tomorrow morning,   R       ? 06/21/21 0932  ? 06/20/21 2205  Sodium, urine, random  Once,   R       ? 06/20/21 2204  ? 06/20/21 2205  Creatinine, urine, random  Once,   R       ? 06/20/21 2204  ? 06/20/21 2204  Urinalysis, Complete w Microscopic  Once,   R       ? 06/20/21 2204  ? ?  ?  ? ?   ? ? ?Signed, ?Terrilee Croak, MD ?[facility] ?06/21/2021 ? ? ? ? ? ? ? ? ? ? ? ? ?

## 2021-06-21 NOTE — ED Notes (Signed)
Received verbal report from [place] at this time ?

## 2021-06-21 NOTE — ED Notes (Signed)
Pt BP running high. MD made aware. Pt breakfast given and she refused to eat. She was readjusted in bed and fresh warm blankets given. ?

## 2021-06-21 NOTE — ED Notes (Signed)
Breakfast order placed ?

## 2021-06-22 DIAGNOSIS — Z9049 Acquired absence of other specified parts of digestive tract: Secondary | ICD-10-CM | POA: Diagnosis not present

## 2021-06-22 DIAGNOSIS — E7800 Pure hypercholesterolemia, unspecified: Secondary | ICD-10-CM | POA: Diagnosis present

## 2021-06-22 DIAGNOSIS — N179 Acute kidney failure, unspecified: Secondary | ICD-10-CM | POA: Diagnosis present

## 2021-06-22 DIAGNOSIS — K219 Gastro-esophageal reflux disease without esophagitis: Secondary | ICD-10-CM | POA: Diagnosis present

## 2021-06-22 DIAGNOSIS — E860 Dehydration: Secondary | ICD-10-CM | POA: Diagnosis present

## 2021-06-22 DIAGNOSIS — E1122 Type 2 diabetes mellitus with diabetic chronic kidney disease: Secondary | ICD-10-CM | POA: Diagnosis present

## 2021-06-22 DIAGNOSIS — Z8249 Family history of ischemic heart disease and other diseases of the circulatory system: Secondary | ICD-10-CM | POA: Diagnosis not present

## 2021-06-22 DIAGNOSIS — Z96611 Presence of right artificial shoulder joint: Secondary | ICD-10-CM | POA: Diagnosis present

## 2021-06-22 DIAGNOSIS — E118 Type 2 diabetes mellitus with unspecified complications: Secondary | ICD-10-CM | POA: Diagnosis not present

## 2021-06-22 DIAGNOSIS — E039 Hypothyroidism, unspecified: Secondary | ICD-10-CM | POA: Diagnosis present

## 2021-06-22 DIAGNOSIS — Z79890 Hormone replacement therapy (postmenopausal): Secondary | ICD-10-CM | POA: Diagnosis not present

## 2021-06-22 DIAGNOSIS — Z79899 Other long term (current) drug therapy: Secondary | ICD-10-CM | POA: Diagnosis not present

## 2021-06-22 DIAGNOSIS — R55 Syncope and collapse: Secondary | ICD-10-CM | POA: Diagnosis present

## 2021-06-22 DIAGNOSIS — Z8673 Personal history of transient ischemic attack (TIA), and cerebral infarction without residual deficits: Secondary | ICD-10-CM | POA: Diagnosis not present

## 2021-06-22 DIAGNOSIS — N189 Chronic kidney disease, unspecified: Secondary | ICD-10-CM | POA: Diagnosis present

## 2021-06-22 DIAGNOSIS — Z7982 Long term (current) use of aspirin: Secondary | ICD-10-CM | POA: Diagnosis not present

## 2021-06-22 DIAGNOSIS — E861 Hypovolemia: Secondary | ICD-10-CM | POA: Diagnosis present

## 2021-06-22 DIAGNOSIS — I129 Hypertensive chronic kidney disease with stage 1 through stage 4 chronic kidney disease, or unspecified chronic kidney disease: Secondary | ICD-10-CM | POA: Diagnosis present

## 2021-06-22 DIAGNOSIS — Z823 Family history of stroke: Secondary | ICD-10-CM | POA: Diagnosis not present

## 2021-06-22 DIAGNOSIS — D649 Anemia, unspecified: Secondary | ICD-10-CM | POA: Diagnosis present

## 2021-06-22 DIAGNOSIS — Z833 Family history of diabetes mellitus: Secondary | ICD-10-CM | POA: Diagnosis not present

## 2021-06-22 DIAGNOSIS — Z20822 Contact with and (suspected) exposure to covid-19: Secondary | ICD-10-CM | POA: Diagnosis present

## 2021-06-22 LAB — CBC WITH DIFFERENTIAL/PLATELET
Abs Immature Granulocytes: 0.02 10*3/uL (ref 0.00–0.07)
Basophils Absolute: 0 10*3/uL (ref 0.0–0.1)
Basophils Relative: 1 %
Eosinophils Absolute: 0.1 10*3/uL (ref 0.0–0.5)
Eosinophils Relative: 2 %
HCT: 29.4 % — ABNORMAL LOW (ref 36.0–46.0)
Hemoglobin: 10.1 g/dL — ABNORMAL LOW (ref 12.0–15.0)
Immature Granulocytes: 0 %
Lymphocytes Relative: 30 %
Lymphs Abs: 1.5 10*3/uL (ref 0.7–4.0)
MCH: 28.7 pg (ref 26.0–34.0)
MCHC: 34.4 g/dL (ref 30.0–36.0)
MCV: 83.5 fL (ref 80.0–100.0)
Monocytes Absolute: 0.4 10*3/uL (ref 0.1–1.0)
Monocytes Relative: 8 %
Neutro Abs: 3.1 10*3/uL (ref 1.7–7.7)
Neutrophils Relative %: 59 %
Platelets: 270 10*3/uL (ref 150–400)
RBC: 3.52 MIL/uL — ABNORMAL LOW (ref 3.87–5.11)
RDW: 18.8 % — ABNORMAL HIGH (ref 11.5–15.5)
WBC: 5.1 10*3/uL (ref 4.0–10.5)
nRBC: 0.4 % — ABNORMAL HIGH (ref 0.0–0.2)

## 2021-06-22 LAB — BASIC METABOLIC PANEL
Anion gap: 6 (ref 5–15)
BUN: 25 mg/dL — ABNORMAL HIGH (ref 8–23)
CO2: 21 mmol/L — ABNORMAL LOW (ref 22–32)
Calcium: 9.7 mg/dL (ref 8.9–10.3)
Chloride: 110 mmol/L (ref 98–111)
Creatinine, Ser: 1.25 mg/dL — ABNORMAL HIGH (ref 0.44–1.00)
GFR, Estimated: 47 mL/min — ABNORMAL LOW (ref >60)
Glucose, Bld: 89 mg/dL (ref 70–99)
Potassium: 3.8 mmol/L (ref 3.5–5.1)
Sodium: 137 mmol/L (ref 135–145)

## 2021-06-22 LAB — GLUCOSE, CAPILLARY
Glucose-Capillary: 90 mg/dL (ref 70–99)
Glucose-Capillary: 112 mg/dL — ABNORMAL HIGH (ref 70–99)
Glucose-Capillary: 115 mg/dL — ABNORMAL HIGH (ref 70–99)
Glucose-Capillary: 115 mg/dL — ABNORMAL HIGH (ref 70–99)

## 2021-06-22 LAB — RETICULOCYTES
Immature Retic Fract: 27.9 % — ABNORMAL HIGH (ref 2.3–15.9)
RBC.: 3.51 MIL/uL — ABNORMAL LOW (ref 3.87–5.11)
Retic Count, Absolute: 58.6 10*3/uL (ref 19.0–186.0)
Retic Ct Pct: 1.7 % (ref 0.4–3.1)

## 2021-06-22 LAB — IRON AND TIBC
Iron: 69 ug/dL (ref 28–170)
Saturation Ratios: 15 % (ref 10.4–31.8)
TIBC: 454 ug/dL — ABNORMAL HIGH (ref 250–450)
UIBC: 385 ug/dL

## 2021-06-22 LAB — FOLATE: Folate: 6.8 ng/mL (ref >5.9)

## 2021-06-22 LAB — FERRITIN: Ferritin: 339 ng/mL — ABNORMAL HIGH (ref 11–307)

## 2021-06-22 LAB — VITAMIN B12: Vitamin B-12: 538 pg/mL (ref 180–914)

## 2021-06-22 MED ORDER — ENSURE ENLIVE PO LIQD
237.0000 mL | Freq: Two times a day (BID) | ORAL | Status: DC
  Administered 2021-06-22 – 2021-06-24 (×4): 237 mL via ORAL

## 2021-06-22 MED ORDER — LORAZEPAM 0.5 MG PO TABS
0.2500 mg | ORAL_TABLET | Freq: Once | ORAL | Status: AC | PRN
  Administered 2021-06-22: 0.25 mg via ORAL
  Filled 2021-06-22: qty 1

## 2021-06-22 MED ORDER — TRAMADOL HCL 50 MG PO TABS
50.0000 mg | ORAL_TABLET | Freq: Four times a day (QID) | ORAL | Status: DC | PRN
  Administered 2021-06-23 – 2021-06-24 (×3): 50 mg via ORAL
  Filled 2021-06-22 (×3): qty 1

## 2021-06-22 MED ORDER — AMITRIPTYLINE HCL 50 MG PO TABS
25.0000 mg | ORAL_TABLET | Freq: Every day | ORAL | Status: DC
  Administered 2021-06-22 – 2021-06-23 (×2): 25 mg via ORAL
  Filled 2021-06-22 (×2): qty 1

## 2021-06-22 MED ORDER — IRBESARTAN 300 MG PO TABS
300.0000 mg | ORAL_TABLET | Freq: Every day | ORAL | Status: DC
  Administered 2021-06-22 – 2021-06-24 (×3): 300 mg via ORAL
  Filled 2021-06-22 (×3): qty 1

## 2021-06-22 NOTE — Progress Notes (Signed)
?PROGRESS NOTE ? ?Sherry Herrera  ?DOB: [birth year]  ?PCP: Greig Right, MD ?MGQ:[id number]  ?DOA: 06/20/2021 ? LOS: 0 days  ?Hospital Day: 3 ? ?Brief narrative: ?Sherry Herrera is a 66 y.o. female with PMH significant for DM2, HTN, HLD, TIA, CKD, GERD, cholelithiasis, hypothyroidism, osteoarthritis was diagnosed with herpes zoster a week ago. ?Patient was brought to the ED from home for syncopal episode.   Earlier in the day of presentation, she had severe nausea, felt lightheaded, diaphoretic, vomited once and passed out transiently.Marland Kitchen ?EMS noted her pale, clammy and diaphoretic.  Second syncopal episode witnessed by EMS.  Patient also reported abdominal pain, nausea, vomiting and diarrhea. ? ?Patient reportedly has nausea, poor appetite since developing cholecystitis and underwent cholecystectomy October 2022.  Last week, she was diagnosed with herpes zoster and was started on treatment.  Her GI symptoms have worsened since then.  ? ?In the ED, patient was found to be afebrile, blood pressure was 130/46 and later on increased up to 180s. ?BMP with BUN/creatinine 40/1.99, magnesium low at 1.3. ?EKG showed LAFB, LVH with repolarization abnormality.  QTc interval 521 ms.  ?Chest x-ray notable for cardiomegaly without acute findings.   ?CT head did not show any acute intracranial normality  ?Patient was given 1 liter of saline and 2 g IV magnesium. ?Kept on Observation to Hospitalist Service ? ?Subjective: ?Patient was seen and examined this morning.   ?Lying on bed.  Not in distress.  Looks sleepy.  Feels better than at presentation but is still tired.  She states area of shingles in her head is still sore.   ?Family not at bedside.  No longer having vomiting or diarrhea.  Appetite gradually improving. ?Blood pressure elevated to 160s ? ?Principal Problem: ?  Syncope ?Active Problems: ?  History of TIA (transient ischemic attack) ?  Type 2 diabetes mellitus with complication, without long-term current use of insulin  ([place]) ?  AKI (acute kidney injury) ([place]) ?  Normocytic anemia ?  Prolonged QT interval ?  Hypomagnesemia ?  Elevated troponin ?  Zoster ?  ? ?Assessment and Plan: ?Syncope  ?-Patient presented with severe nausea, 1 episode of vomiting, diaphoresis and 2 episodes of syncope.  ?-History likely suggestive of vasovagal/hypovolemic syncope due to worsening fluid loss from vomiting and diarrhea. ?-Continue to monitor in telemetry ?-Continue to monitor vitals. ? ?Acute on chronic nausea/vomiting ?-Patient reports some nausea and decreased appetite since cholecystectomy in October 2022 but has worsened acutely since starting treatment for Zoster a week ago  ?-Antiviral course completed.  Exam benign.  Vomiting and diarrhea improving. ?-Encourage oral intake. ?  ?Recent herpes zoster  ?-Patient recently had herpes zoster in the right side of her scalp.  They appear crusted and resolving. ?-But they are sore to touch.  Patient may benefit from Elavil.  I will start her on Elavil 25 mg from tonight. ? ?AKI ?-Presented with creatinine elevated to 1.99, unclear baseline.  Creatinine elevation likely because of fluid loss.  Improving with hydration. ?Recent Labs  ?  06/20/21 ?1924 06/21/21 ?0228 06/22/21 ?0336  ?BUN 40* 34* 25*  ?CREATININE 1.99* 1.60* 1.25*  ? ? ?Essential hypertension ?-PTA, patient was on amlodipine 5 mg daily, clonidine 0.1 mg daily, olmesartan 40 mg daily, HCTZ 12.5 mg daily, ?-Currently on amlodipine and clonidine.  Olmesartan and HCTZ on hold.  Blood pressure elevated over 160s overnight with improving creatinine, will resume olmesartan today.  (Irbesartan per formulary).  Keep HCTZ on hold.  Continue to monitor blood pressure  ? ?  Type 2 diabetes mellitus ?-A1c 5.6 from May 2022 ?-Home meds include Actos 45 mg daily ?-Currently on sliding scale insulin with Accu-Cheks ?Recent Labs  ?Lab 06/21/21 ?1204 06/21/21 ?1651 06/21/21 ?2117 06/22/21 ?0802 06/22/21 ?1117  ?GLUCAP 91 102* 94 90 112*  ? ? ? ?Elevated  troponin  ?-Troponin slightly elevated, is decreasing, and without angina  ?-Low-suspicion for ACS, no mention of wall motion abnormality in the echocardiogram. ?Recent Labs  ?  06/20/21 ?1924 06/20/21 ?2201  ?TROPONINIHS 34* 30*  ? ?  ?Hx of TIA  ?Hyperlipidemia ?-Continue ASA, statin and fenofibrate. ?  ?Prolonged QT interval  ?Hypomagnesemia ?-QTc is 521 ms on admission in setting of hypomagnesemia  ?-Magnesium level replaced. ?Recent Labs  ?Lab 06/20/21 ?1924 06/21/21 ?0228 06/22/21 ?0336  ?K 3.7 3.9 3.8  ?MG 1.3* 2.3  --   ? ? ?Hypothyroidism ?-Continue Synthroid ?  ?Anemia  ?-Hgb is 9.8 on admission, down from 13.2 in 2009  ?-Pt denies bleeding and FOBT is negative in ED  ?-Obtain anemia panel. ? ?Recent Labs  ?  06/20/21 ?1924 06/21/21 ?0228 06/22/21 ?0336  ?HGB 9.8* 9.8* 10.1*  ?MCV 87.9 83.8 83.5  ?[id number]  --   --  538  ?FOLATE  --   --  6.8  ?FERRITIN  --   --  339*  ?TIBC  --   --  454*  ?IRON  --   --  69  ?RETICCTPCT  --   --  1.7  ? ? ?Goals of care ?  Code Status: Full Code  ? ? ?Mobility: Encourage ambulation.  PT eval ? ?Skin assessment:  ?  ? ?Nutritional status:  ?Body mass index is 28.38 kg/m?.  ?  ?  ? ? ? ? ?Diet:  ?Diet Order   ? ?       ?  Diet heart healthy/carb modified Room service appropriate? Yes; Fluid consistency: Thin  Diet effective now       ?  ? ?  ?  ? ?  ? ? ?DVT prophylaxis:  ?heparin injection 5,000 Units Start: 06/20/21 2215 ?  ?Antimicrobials: None ?Fluid: NS at 75 mill per hour ?Consultants: None ?Family Communication: None at bedside ? ?Status is: Observation ? ?Continue in-hospital care because: Poor appetite, needs IV fluid, needs better pain control, pending PT eval ?Level of care: Telemetry Medical  ? ?Dispo: The patient is from: Home ?             Anticipated d/c is to: Pending PT eval, pending clinical course ?             Patient currently is not medically stable to d/c. ?  Difficult to place patient No ? ? ? ? ?Infusions:  ? sodium chloride 75 mL/hr at 06/22/21  1050  ? ? ?Scheduled Meds: ? amLODipine  5 mg Oral Daily  ? aspirin EC  81 mg Oral Daily  ? atorvastatin  80 mg Oral QHS  ? cloNIDine  0.1 mg Oral Daily  ? heparin  5,000 Units Subcutaneous Q8H  ? insulin aspart  0-6 Units Subcutaneous TID WC  ? levothyroxine  88 mcg Oral Daily  ? sodium chloride flush  3 mL Intravenous Q12H  ? ? ?PRN meds: ?acetaminophen **OR** acetaminophen, hydrALAZINE, senna-docusate, traMADol  ? ?Antimicrobials: ?Anti-infectives (From admission, onward)  ? ? Start     Dose/Rate Route Frequency Ordered Stop  ? 06/21/21 1430  valACYclovir (VALTREX) tablet 1,000 mg       ? 1,000 mg Oral  2 times daily 06/21/21 1422 06/21/21 2204  ? ?  ? ? ?Objective: ?Vitals:  ? 06/22/21 1117 06/22/21 1300  ?BP: (!) 163/50 (!) 152/61  ?Pulse: 74   ?Resp: 18 (!) 25  ?Temp: 98.3 ?F (36.8 ?C)   ?SpO2: 100%   ? ? ?Intake/Output Summary (Last 24 hours) at 06/22/2021 1430 ?Last data filed at 06/22/2021 1023 ?Gross per 24 hour  ?Intake 1341.08 ml  ?Output 500 ml  ?Net 841.08 ml  ? ? ?Filed Weights  ? 06/21/21 2056 06/22/21 0800  ?Weight: 73.1 kg 75 kg  ? ?Weight change:  ?Body mass index is 28.38 kg/m?.  ? ?Physical Exam: ?General exam: Pleasant, elderly Caucasian female with not in visible distress.  Feels poor and sleepy ?Skin: No rashes, lesions or ulcers. ?HEENT: Atraumatic, normocephalic, no obvious bleeding.  Right parietal scalp with herpetic lesions, crusting ?Lungs: Clear to auscultation bilaterally ?CVS: Regular rate and rhythm, no murmur ?GI/Abd soft, nontender, nondistended, bowel sound present ?CNS: Opens eyes on verbal command, oriented x3 ?Psychiatry: Sad affect ?Extremities: No pedal edema, no calf tenderness ? ?Data Review: I have personally reviewed the laboratory data and studies available. ? ?F/u labs ordered ?Unresulted Labs (From admission, onward)  ? ? None  ? ?  ? ? ?Signed, ?Terrilee Croak, MD ?[facility] ?06/22/2021 ? ? ? ? ? ? ? ? ? ? ? ? ?

## 2021-06-22 NOTE — Care Management Obs Status (Signed)
MEDICARE OBSERVATION STATUS NOTIFICATION ? ? ?Patient Details  ?Name: Sherry Herrera ?MRN: [id number] ?Date of Birth: December 22, 1955 ? ? ?Medicare Observation Status Notification Given:  Yes ? ? ? ?Joanne Chars, LCSW ?06/22/2021, 9:34 AM ?

## 2021-06-22 NOTE — Evaluation (Signed)
Physical Therapy Evaluation & Discharge ?Patient Details ?Name: Sherry Herrera ?MRN: [id number] ?DOB: 1955/08/20 ?Today's Date: 06/22/2021 ? ?History of Present Illness ? Pt is a 66 y.o. female admitted 06/20/21 with syncopal episode at home, second episode witnessed by EMS, also with nausea/vomiiting. Workup suggestive of vasovagal/hypovolemic syncope. PMH includes recent tx for herpes zoster, HTN, DM2, CKD, TIA, anemia, OA. ?  ?Clinical Impression ? Patient evaluated by Physical Therapy with no further acute PT needs identified. PTA, pt independent without DME, lives with roommate and daughter next door able to provide assist if needed. Today, pt independent with mobility and ADL tasks, only requiring assist to manage lines; noted drop in standing BP (see values below), pt reports asymptomatic. All education has been completed and the patient has no further questions. Acute PT is signing off. Thank you for this referral. ?   ?Orthostatic BPs ?Supine 182/46  ?Standing 145/56  ?Post-ambulation 146/46  ? ?HR 80s-90s, SpO2 100% on RA   ? ?Recommendations for follow up therapy are one component of a multi-disciplinary discharge planning process, led by the attending physician.  Recommendations may be updated based on patient status, additional functional criteria and insurance authorization. ? ?Follow Up Recommendations No PT follow up ? ?  ?Assistance Recommended at Discharge PRN  ?Patient can return home with the following ? Assistance with cooking/housework ? ?  ?Equipment Recommendations None recommended by PT  ?Recommendations for Other Services ?   N/A ?  ?Functional Status Assessment    ? ?  ?Precautions / Restrictions Precautions ?Precautions: Other (comment) ?Precaution Comments: syncopal episode leading to admission; herpes zoster R-side scalp ?Restrictions ?Weight Bearing Restrictions: No  ? ?  ? ?Mobility ? Bed Mobility ?Overal bed mobility: Modified Independent ?  ?  ?  ?  ?  ?  ?General bed mobility comments:  HOB elevated ?  ? ?Transfers ?Overall transfer level: Independent ?Equipment used: None ?  ?  ?  ?  ?  ?  ?  ?General transfer comment: indep to stand from EOB, recliner and toilet ?  ? ?Ambulation/Gait ?Ambulation/Gait assistance: Independent ?Gait Distance (Feet): 40 Feet ?Assistive device: None ?Gait Pattern/deviations: WFL(Within Functional Limits) ?Gait velocity: Decreased ?  ?  ?General Gait Details: Slow, steady ambulation in room indep without DME, only requiring assist to manage lines; pt denies pre-syncopal symptoms ? ?Stairs ?  ?  ?  ?  ?  ? ?Wheelchair Mobility ?  ? ?Modified Rankin (Stroke Patients Only) ?  ? ?  ? ?Balance Overall balance assessment: No apparent balance deficits (not formally assessed) ?  ?  ?Sitting balance - Comments: pt indep with toileting ?  ?  ?  ?Standing balance comment: pt indep with self-care tasks standing at sink without UE support ?  ?  ?  ?  ?  ?  ?  ?  ?  ?  ?  ?   ? ? ? ?Pertinent Vitals/Pain Pain Assessment ?Pain Assessment: No/denies pain  ? ? ?Home Living Family/patient expects to be discharged to:: Private residence ?Living Arrangements: Non-relatives/Friends ?Available Help at Discharge: Family;Available 24 hours/day ?Type of Home: House ?Home Access: Stairs to enter ?Entrance Stairs-Rails: Right ?Entrance Stairs-Number of Steps: 3 ?  ?Home Layout: One level ?Home Equipment: None ?Additional Comments: lives with roommate; daughter lives next door and able to assist PRN if needed  ?  ?Prior Function Prior Level of Function : Independent/Modified Independent;Driving ?  ?  ?  ?  ?  ?  ?Mobility Comments: No longer  works; used to be Quarry manager at [employer]. Enjoys caring for her dog ?  ?  ? ? ?Hand Dominance  ?   ? ?  ?Extremity/Trunk Assessment  ? Upper Extremity Assessment ?Upper Extremity Assessment: Overall WFL for tasks assessed ?  ? ?Lower Extremity Assessment ?Lower Extremity Assessment: Overall WFL for tasks assessed ?  ? ?   ?Communication  ? Communication: No difficulties   ?Cognition Arousal/Alertness: Awake/alert ?Behavior During Therapy: [facility] for tasks assessed/performed ?Overall Cognitive Status: Within Functional Limits for tasks assessed ?  ?  ?  ?  ?  ?  ?  ?  ?  ?  ?  ?  ?  ?  ?  ?  ?  ?  ?  ? ?  ?General Comments General comments (skin integrity, edema, etc.): Supine BP 182/46, standing BP 145/56 (pt asymptomatic), post-ambulation BP 146/46; HR 80s-90s, SpO2 100% on RA ? ?  ?Exercises    ? ?Assessment/Plan  ?  ?PT Assessment Patient does not need any further PT services  ?PT Problem List   ? ?   ?  ?PT Treatment Interventions     ? ?PT Goals (Current goals can be found in the Care Plan section)  ?Acute Rehab PT Goals ?PT Goal Formulation: All assessment and education complete, DC therapy ? ?  ?Frequency   ?  ? ? ?Co-evaluation   ?  ?  ?  ?  ? ? ?  ?AM-PAC PT "6 Clicks" Mobility  ?Outcome Measure Help needed turning from your back to your side while in a flat bed without using bedrails?: None ?Help needed moving from lying on your back to sitting on the side of a flat bed without using bedrails?: None ?Help needed moving to and from a bed to a chair (including a wheelchair)?: None ?Help needed standing up from a chair using your arms (e.g., wheelchair or bedside chair)?: None ?Help needed to walk in hospital room?: None ?Help needed climbing 3-5 steps with a railing? : None ?6 Click Score: 24 ? ?  ?End of Session Equipment Utilized During Treatment: Gait belt ?Activity Tolerance: Patient tolerated treatment well ?Patient left: in chair;with call bell/phone within reach ?Nurse Communication: Mobility status ?PT Visit Diagnosis: Other abnormalities of gait and mobility (R26.89) ?  ? ?Time: 4270-6237 ?PT Time Calculation (min) (ACUTE ONLY): 24 min ? ? ?Charges:   PT Evaluation ?$PT Eval Moderate Complexity: 1 Mod ?  ?  ?   ?Mabeline Caras, PT, DPT ?Acute Rehabilitation Services  ?Pager [phone] ?Office [phone] ? ?Derry Lory ?06/22/2021, 4:33 PM ? ?

## 2021-06-22 NOTE — Progress Notes (Signed)
Initial Nutrition Assessment ? ?DOCUMENTATION CODES:  ? ?Not applicable ? ?INTERVENTION:  ?Provide Ensure Enlive po BID, each supplement provides 350 kcal and 20 grams of protein. ? ?Encourage adequate PO intake.  ? ?NUTRITION DIAGNOSIS:  ? ?Increased nutrient needs related to acute illness as evidenced by estimated needs. ? ?GOAL:  ? ?Patient will meet greater than or equal to 90% of their needs ? ?MONITOR:  ? ?PO intake, Supplement acceptance, Labs, Weight trends, Skin, I & O's ? ?REASON FOR ASSESSMENT:  ? ?Malnutrition Screening Tool ?  ? ?ASSESSMENT:  ? ?66 y.o. female with PMH significant for DM2, HTN, HLD, TIA, CKD, GERD, cholelithiasis, hypothyroidism, osteoarthritis was diagnosed with herpes zoster a week ago. Presents for syncopal episode at home. ? ?Pt reports appetite has been improving. Pt reports consuming ~75% of lunch today. Pt reports eating well prior to shingles diagnosis. She reports living with her daughter and all meals are cooked and provided. Pt reports usual body weight of ~161 lbs. RD to order nutritional supplements to aid in caloric and protein needs. Unable to complete Nutrition-Focused physical exam at this time.  ? ?Labs and mediations reviewed.  ? ?Diet Order:   ?Diet Order   ? ?       ?  Diet heart healthy/carb modified Room service appropriate? Yes; Fluid consistency: Thin  Diet effective now       ?  ? ?  ?  ? ?  ? ? ?EDUCATION NEEDS:  ? ?Not appropriate for education at this time ? ?Skin:  Skin Assessment: Reviewed RN Assessment ? ?Last BM:  5/9 ? ?Height:  ? ?Ht Readings from Last 1 Encounters:  ?06/21/21 '5\' 4"'$  (1.626 m)  ? ? ?Weight:  ? ?Wt Readings from Last 1 Encounters:  ?06/22/21 75 kg  ? ?BMI:  Body mass index is 28.38 kg/m?. ? ?Estimated Nutritional Needs:  ? ?Kcal:  1850-2050 ? ?Protein:  90-100 grams ? ?Fluid:  >/= 1.8 L/day ? ?Corrin Parker, MS, RD, LDN ?RD pager number/after hours weekend pager number on Amion. ? ?

## 2021-06-23 ENCOUNTER — Telehealth: Payer: Self-pay | Admitting: *Deleted

## 2021-06-23 DIAGNOSIS — N179 Acute kidney failure, unspecified: Secondary | ICD-10-CM | POA: Diagnosis not present

## 2021-06-23 DIAGNOSIS — Z8673 Personal history of transient ischemic attack (TIA), and cerebral infarction without residual deficits: Secondary | ICD-10-CM | POA: Diagnosis not present

## 2021-06-23 DIAGNOSIS — R55 Syncope and collapse: Secondary | ICD-10-CM | POA: Diagnosis not present

## 2021-06-23 LAB — BASIC METABOLIC PANEL WITH GFR
Anion gap: 4 — ABNORMAL LOW (ref 5–15)
BUN: 24 mg/dL — ABNORMAL HIGH (ref 8–23)
CO2: 21 mmol/L — ABNORMAL LOW (ref 22–32)
Calcium: 9.6 mg/dL (ref 8.9–10.3)
Chloride: 112 mmol/L — ABNORMAL HIGH (ref 98–111)
Creatinine, Ser: 1.17 mg/dL — ABNORMAL HIGH (ref 0.44–1.00)
GFR, Estimated: 51 mL/min — ABNORMAL LOW
Glucose, Bld: 93 mg/dL (ref 70–99)
Potassium: 3.9 mmol/L (ref 3.5–5.1)
Sodium: 137 mmol/L (ref 135–145)

## 2021-06-23 LAB — GLUCOSE, CAPILLARY
Glucose-Capillary: 91 mg/dL (ref 70–99)
Glucose-Capillary: 101 mg/dL — ABNORMAL HIGH (ref 70–99)
Glucose-Capillary: 109 mg/dL — ABNORMAL HIGH (ref 70–99)
Glucose-Capillary: 96 mg/dL (ref 70–99)
Glucose-Capillary: 96 mg/dL (ref 70–99)

## 2021-06-23 LAB — CBC WITH DIFFERENTIAL/PLATELET
Abs Immature Granulocytes: 0.02 K/uL (ref 0.00–0.07)
Basophils Absolute: 0 K/uL (ref 0.0–0.1)
Basophils Relative: 0 %
Eosinophils Absolute: 0.1 K/uL (ref 0.0–0.5)
Eosinophils Relative: 2 %
HCT: 28.1 % — ABNORMAL LOW (ref 36.0–46.0)
Hemoglobin: 9.4 g/dL — ABNORMAL LOW (ref 12.0–15.0)
Immature Granulocytes: 0 %
Lymphocytes Relative: 37 %
Lymphs Abs: 1.8 K/uL (ref 0.7–4.0)
MCH: 28 pg (ref 26.0–34.0)
MCHC: 33.5 g/dL (ref 30.0–36.0)
MCV: 83.6 fL (ref 80.0–100.0)
Monocytes Absolute: 0.4 K/uL (ref 0.1–1.0)
Monocytes Relative: 8 %
Neutro Abs: 2.6 K/uL (ref 1.7–7.7)
Neutrophils Relative %: 53 %
Platelets: 286 K/uL (ref 150–400)
RBC: 3.36 MIL/uL — ABNORMAL LOW (ref 3.87–5.11)
RDW: 19.1 % — ABNORMAL HIGH (ref 11.5–15.5)
WBC: 4.9 K/uL (ref 4.0–10.5)
nRBC: 0.6 % — ABNORMAL HIGH (ref 0.0–0.2)

## 2021-06-23 NOTE — Plan of Care (Signed)
?  Problem: Clinical Measurements: ?Goal: Diagnostic test results will improve ?Outcome: Not Progressing ?  ?Problem: Activity: ?Goal: Risk for activity intolerance will decrease ?Outcome: Not Progressing ?  ?Problem: Pain Managment: ?Goal: General experience of comfort will improve ?Outcome: Not Progressing ?  ?

## 2021-06-23 NOTE — Progress Notes (Signed)
?PROGRESS NOTE ? ?Sherry Herrera  ?DOB: 1955/12/31  ?PCP: Greig Right, MD ?JYN:[id number]  ?DOA: 06/20/2021 ? LOS: 1 day  ?Hospital Day: 4 ? ?Brief narrative: ?Sherry Herrera is a 66 y.o. female with PMH significant for DM2, HTN, HLD, TIA, CKD, GERD, cholelithiasis, hypothyroidism, osteoarthritis was diagnosed with herpes zoster a week ago. ?Patient was brought to the ED from home for syncopal episode.   Earlier in the day of presentation, she had severe nausea, felt lightheaded, diaphoretic, vomited once and passed out transiently.Marland Kitchen ?EMS noted her pale, clammy and diaphoretic.  Second syncopal episode witnessed by EMS.  Patient also reported abdominal pain, nausea, vomiting and diarrhea. ? ?Patient reportedly has nausea, poor appetite since developing cholecystitis and underwent cholecystectomy October 2022.  Last week, she was diagnosed with herpes zoster and was started on treatment.  Her GI symptoms have worsened since then.  ? ?In the ED, patient was found to be afebrile, blood pressure was 130/46 and later on increased up to 180s. ?BMP with BUN/creatinine 40/1.99, magnesium low at 1.3. ?EKG showed LAFB, LVH with repolarization abnormality.  QTc interval 521 ms.  ?Chest x-ray notable for cardiomegaly without acute findings.   ?CT head did not show any acute intracranial normality  ?Patient was given 1 liter of saline and 2 g IV magnesium. ?Kept on Observation to Hospitalist Service ? ?Subjective: ?Patient seen and examined.  Pain is controlled.  Patient still feels woozy and uncomfortable walking.  Blood pressures adequate.  No drop in orthostatic.  She had nausea but this is chronic as per her. ? ?Principal Problem: ?  Syncope ?Active Problems: ?  History of TIA (transient ischemic attack) ?  Type 2 diabetes mellitus with complication, without long-term current use of insulin ([place]) ?  AKI (acute kidney injury) ([place]) ?  Normocytic anemia ?  Prolonged QT interval ?  Hypomagnesemia ?  Elevated troponin ?   Zoster ?  ? ?Assessment and Plan: ?Syncope  ?-Patient presented with severe nausea, 1 episode of vomiting, diaphoresis and 2 episodes of syncope.  ?-History likely suggestive of vasovagal/hypovolemic syncope due to worsening fluid loss from vomiting and diarrhea. ?-Telemetry monitor without events.  Can discontinue. ?-Given poor appetite and sense of dehydration, continue IV fluids today.  Continue to work with the mobility and increase mobility. ? ?Acute on chronic nausea/vomiting ?-Patient reports some nausea and decreased appetite since cholecystectomy in October 2022 but has worsened acutely since starting treatment for Zoster a week ago  ?-Antiviral course completed.  Exam benign.  Vomiting and diarrhea improving. ?-Encourage oral intake. ?  ?Recent herpes zoster  ?-Patient recently had herpes zoster in the right side of her scalp.  They appear crusted and resolving. ?-Started on amitriptyline, will continue. ? ?AKI ?-Presented with creatinine elevated to 1.99, unclear baseline.  Creatinine elevation likely because of fluid loss.  Improving with hydration.  Normalizing. ? ?Recent Labs  ?  06/20/21 ?1924 06/21/21 ?0228 06/22/21 ?0336 06/23/21 ?0603  ?BUN 40* 34* 25* 24*  ?CREATININE 1.99* 1.60* 1.25* 1.17*  ? ?Essential hypertension ?-PTA, patient was on amlodipine 5 mg daily, clonidine 0.1 mg daily, olmesartan 40 mg daily, HCTZ 12.5 mg daily, ?-Currently on amlodipine and clonidine and olmesartan..  Blood pressures acceptable. ? ?Type 2 diabetes mellitus ?-A1c 5.6 from May 2022 ?-Home meds include Actos 45 mg daily ?-Currently on sliding scale insulin with Accu-Cheks ?Recent Labs  ?Lab 06/22/21 ?1539 06/22/21 ?1944 06/23/21 ?8657 06/23/21 ?0809 06/23/21 ?1217  ?GLUCAP 115* 115* 91 101* 109*  ? ? ?Elevated  troponin  ?-Troponin slightly elevated, is decreasing, and without angina  ?-Low-suspicion for ACS, no mention of wall motion abnormality in the echocardiogram. ?Recent Labs  ?  06/20/21 ?1924 06/20/21 ?2201   ?TROPONINIHS 34* 30*  ?  ?Hx of TIA  ?Hyperlipidemia ?-Continue ASA, statin and fenofibrate. ?  ?Prolonged QT interval  ?Hypomagnesemia ?-QTc is 521 ms on admission in setting of hypomagnesemia  ?-Magnesium level replaced and adequate. ?Recent Labs  ?Lab 06/20/21 ?1924 06/21/21 ?0228 06/22/21 ?1660 06/23/21 ?0603  ?K 3.7 3.9 3.8 3.9  ?MG 1.3* 2.3  --   --   ? ?Hypothyroidism ?-Continue Synthroid ?  ?Anemia  ?-Hgb is 9.4 on admission, down from 13.2 in 2009  ?-Pt denies bleeding and FOBT is negative in ED  ? ? ?Recent Labs  ?  06/20/21 ?1924 06/21/21 ?0228 06/22/21 ?0336 06/23/21 ?0603  ?HGB 9.8* 9.8* 10.1* 9.4*  ?MCV 87.9 83.8 83.5 83.6  ?[id number]  --   --  538  --   ?FOLATE  --   --  6.8  --   ?FERRITIN  --   --  339*  --   ?TIBC  --   --  454*  --   ?IRON  --   --  69  --   ?RETICCTPCT  --   --  1.7  --   ? ?Goals of care ?  Code Status: Full Code  ? ? ?Mobility: Encourage ambulation.  PT eval ? ?Skin assessment:  ?  ? ?Nutritional status:  ?Body mass index is 29.06 kg/m?Marland Kitchen  ?Nutrition Problem: Increased nutrient needs ?Etiology: acute illness ?Signs/Symptoms: estimated needs ? ? ? ? ?Diet:  ?Diet Order   ? ?       ?  Diet heart healthy/carb modified Room service appropriate? Yes; Fluid consistency: Thin  Diet effective now       ?  ? ?  ?  ? ?  ? ? ?DVT prophylaxis:  ?heparin injection 5,000 Units Start: 06/20/21 2215 ?  ?Antimicrobials: None ?Fluid: NS at 75 mill per hour ?Consultants: None ?Family Communication: None at bedside ? ?Status is: Inpatient. ? ?Continue in-hospital care because: Poor appetite, needs IV fluid, needs better pain control, pending PT eval ?Level of care: Telemetry Medical  ? ?Dispo: The patient is from: Home ?             Anticipated d/c is to: Pending PT eval, pending clinical course ?             Patient currently is not medically stable to d/c. ?  Difficult to place patient No ? ? ? ? ?Infusions:  ? sodium chloride 75 mL/hr at 06/23/21 0943  ? ? ?Scheduled Meds: ? amitriptyline  25  mg Oral QHS  ? amLODipine  5 mg Oral Daily  ? aspirin EC  81 mg Oral Daily  ? atorvastatin  80 mg Oral QHS  ? cloNIDine  0.1 mg Oral Daily  ? feeding supplement  237 mL Oral BID BM  ? heparin  5,000 Units Subcutaneous Q8H  ? insulin aspart  0-6 Units Subcutaneous TID WC  ? irbesartan  300 mg Oral Daily  ? levothyroxine  88 mcg Oral Daily  ? sodium chloride flush  3 mL Intravenous Q12H  ? ? ?PRN meds: ?acetaminophen **OR** acetaminophen, hydrALAZINE, senna-docusate, traMADol  ? ?Antimicrobials: ?Anti-infectives (From admission, onward)  ? ? Start     Dose/Rate Route Frequency Ordered Stop  ? 06/21/21 1430  valACYclovir (VALTREX) tablet 1,000 mg       ?  1,000 mg Oral 2 times daily 06/21/21 1422 06/21/21 2204  ? ?  ? ? ?Objective: ?Vitals:  ? 06/23/21 0639 06/23/21 0810  ?BP: (!) 141/52 (!) 150/61  ?Pulse: 76 81  ?Resp: (!) 25 (!) 23  ?Temp:  98.8 ?F (37.1 ?C)  ?SpO2: 97% 97%  ? ? ?Intake/Output Summary (Last 24 hours) at 06/23/2021 1306 ?Last data filed at 06/23/2021 0510 ?Gross per 24 hour  ?Intake 2156.05 ml  ?Output --  ?Net 2156.05 ml  ? ?Filed Weights  ? 06/21/21 2056 06/22/21 0800 06/23/21 0639  ?Weight: 73.1 kg 75 kg 76.8 kg  ? ?Weight change: 1.88 kg ?Body mass index is 29.06 kg/m?.  ? ?Physical Exam: ?General: Looks comfortable.  Flat affect.  Slightly anxious.  Cushingoid face. ?Cardiovascular: S1-S2 normal.  Regular rate rhythm. ?Respiratory: Bilateral clear.  No added sounds. ?Gastrointestinal: Soft.  Nontender good bowel sound present. ?Ext: No edema or cyanosis.  No swelling.  No deformities. ?Neuro: Intact.  Moves all extremities. ?Musculoskeletal: No deformities. ? ? ? ?Data Review: I have personally reviewed the laboratory data and studies available. ? ?F/u labs ordered ?Unresulted Labs (From admission, onward)  ? ? None  ? ?  ? ? ?Signed, ?Barb Merino, MD ?[facility] ?06/23/2021 ? ? ? ? ? ? ? ? ? ? ? ? ?

## 2021-06-23 NOTE — Progress Notes (Signed)
Mobility Specialist Progress Note  ? ? 06/23/21 1124  ?Mobility  ?Activity Ambulated with assistance in room  ?Level of Assistance Contact guard assist, steadying assist  ?Assistive Device Other (Comment) ?(HHA)  ?Distance Ambulated (ft) 125 ft  ?Activity Response Tolerated fair  ?$Mobility charge 1 Mobility  ? ?Post-Mobility: 155/54 BP ? ?Pt received in chair and agreeable. C/o feeling tired and woozy. Returned to chair with a BP of 155/54. Left with chair alarm on and call bell in reach.  ? ?Hildred Alamin ?Mobility Specialist  ?Primary: 5N M.S. Phone: [phone] ?Secondary: 6N M.S. Phone: [phone] ?  ?

## 2021-06-23 NOTE — Telephone Encounter (Signed)
Left message on voicemail per DPR in reference to upcoming appointment scheduled on 07/01/21 at 0800 with detailed instructions given per Myocardial Perfusion Study Information Sheet for the test. LM to arrive 15 minutes early, and that it is imperative to arrive on time for appointment to keep from having the test rescheduled. If you need to cancel or reschedule your appointment, please call the office within 24 hours of your appointment. Failure to do so may result in a cancellation of your appointment, and a $50 no show fee. Phone number given for call back for any questions. Nels Munn, Ranae Palms ? ? ?

## 2021-06-24 LAB — GLUCOSE, CAPILLARY
Glucose-Capillary: 87 mg/dL (ref 70–99)
Glucose-Capillary: 96 mg/dL (ref 70–99)

## 2021-06-24 MED ORDER — PROMETHAZINE HCL 12.5 MG PO TABS: 12.5000 mg | ORAL_TABLET | Freq: Four times a day (QID) | ORAL | 0 refills | Status: DC | PRN

## 2021-06-24 MED ORDER — AMITRIPTYLINE HCL 25 MG PO TABS: 25.0000 mg | ORAL_TABLET | Freq: Every day | ORAL | 0 refills | Status: DC

## 2021-06-24 MED ORDER — VALACYCLOVIR HCL 500 MG PO TABS: 500.0000 mg | ORAL_TABLET | Freq: Three times a day (TID) | ORAL | 0 refills | Status: AC

## 2021-06-24 NOTE — Discharge Summary (Signed)
Physician Discharge Summary  ?Sherry Herrera ZJQ:[id number] DOB: 10-31-1955 DOA: 06/20/2021 ? ?PCP: Greig Right, MD ? ?Admit date: 06/20/2021 ?Discharge date: 06/24/2021 ? ?Admitted From: Home ?Disposition: Home ? ?Recommendations for Outpatient Follow-up:  ?Follow up with PCP in 1-2 weeks ?Please obtain BMP/CBC in one week ?Hydrate yourself well ? ?Home Health: Not applicable ?Equipment/Devices: Not applicable ? ?Discharge Condition: Stable ?CODE STATUS: Full code ?Diet recommendation: Low-salt diet, small portions frequent meals. ? ?Discharge summary: ?Sherry Herrera is a 66 y.o. female with PMH significant for DM2, HTN, HLD, TIA, CKD, GERD, cholelithiasis, hypothyroidism, osteoarthritis was diagnosed with herpes zoster a week ago. Earlier in the day of presentation, she had severe nausea, felt lightheaded, diaphoretic, vomited once and passed out transiently.Marland Kitchen ?EMS noted her pale, clammy and diaphoretic.  Second syncopal episode witnessed by EMS.  Patient also reported abdominal pain, nausea, vomiting and diarrhea. ?  ?Patient reportedly has nausea, poor appetite since developing cholecystitis and underwent cholecystectomy October 2022.  Last week, she was diagnosed with herpes zoster and was started on treatment.  Her GI symptoms have worsened since then.  ?  ?In the ED, patient was found to be afebrile, blood pressure was 130/46 and later on increased up to 180s. ?BMP with BUN/creatinine 40/1.99, magnesium low at 1.3. ?EKG showed LAFB, LVH with repolarization abnormality.  QTc interval 521 ms.  ?Chest x-ray notable for cardiomegaly without acute findings.   ?CT head did not show any acute intracranial normality  ?Patient was given 1 liter of saline and 2 g IV magnesium. ? ? Assessment and Plan: ?Syncope  ?-Patient presented with severe nausea, 1 episode of vomiting, diaphoresis and 2 episodes of syncope.  ?-History likely suggestive of vasovagal/hypovolemic syncope due to worsening fluid loss from vomiting and  diarrhea. ?-Telemetry monitor without events. ?-Symptomatically improved and no recurrence of symptoms. ?  ?Acute on chronic nausea/vomiting ?-Patient reports some nausea and decreased appetite since cholecystectomy in October 2022 but has worsened acutely since starting treatment for Zoster a week ago  ?-Antiviral course completed.  Exam benign.  Vomiting and diarrhea improving. ?-Encourage oral intake.  Encourage oral hydration. ?-Patient does have chronic nausea, will prescribe Phenergan.  Avoiding QT prolonging medications.  She will use for refractory nausea only. ?  ?Recent herpes zoster  ?-Patient recently had herpes zoster in the right side of her scalp.  They appear crusted and resolving. ?-Completed Valtrex. ?-Prescribed 7 days course of Valtrex if any recurrent symptoms to start on the day of noticing lesions. ?-She tolerated amitriptyline, will prescribe low-dose amitriptyline for a month. ?  ?AKI ?-Presented with creatinine elevated to 1.99, unclear baseline.  Creatinine elevation likely because of fluid loss.  Improving with hydration.  Normalizing. ?  ?Recent Labs (within last 365 days)  ?      ?Recent Labs  ?  06/20/21 ?1924 06/21/21 ?0228 06/22/21 ?0336 06/23/21 ?0603  ?BUN 40* 34* 25* 24*  ?CREATININE 1.99* 1.60* 1.25* 1.17*  ?  ?  ?Essential hypertension ?-PTA, patient was on amlodipine 5 mg daily, clonidine 0.1 mg daily, olmesartan 40 mg daily, HCTZ 12.5 mg daily, ?-Blood pressure acceptable.  No more orthostatics.  Resume all home medications. ?  ?Type 2 diabetes mellitus ?-A1c 5.6 from May 2022 ?-Home meds include Actos 45 mg daily.  Resume. ?  ?Hx of TIA  ?Hyperlipidemia ?-Continue ASA, statin and fenofibrate. ?  ?Prolonged QT interval  ?Hypomagnesemia ?-QTc is 521 ms on admission in setting of hypomagnesemia  ?-Magnesium level replaced and adequate. ?Last Labs   ?      ?  Recent Labs  ?Lab 06/20/21 ?1924 06/21/21 ?0228 06/22/21 ?0263 06/23/21 ?0603  ?K 3.7 3.9 3.8 3.9  ?MG 1.3* 2.3  --   --   ?   ?  ?Hypothyroidism ?-Continue Synthroid ? ?Stable for discharge home today. ?  ? ?Discharge Diagnoses:  ?Principal Problem: ?  Syncope ?Active Problems: ?  History of TIA (transient ischemic attack) ?  Type 2 diabetes mellitus with complication, without long-term current use of insulin ([place]) ?  AKI (acute kidney injury) ([place]) ?  Normocytic anemia ?  Prolonged QT interval ?  Hypomagnesemia ?  Elevated troponin ?  Zoster ? ? ? ?Discharge Instructions ? ?Discharge Instructions   ? ? Call MD for:  persistant dizziness or light-headedness   Complete by: As directed ?  ? Diet - low sodium heart healthy   Complete by: As directed ?  ? Hydrate yourself well. Monitor blood pressures at home and keep a log book and bring it to doctors office on follow up.  ? Increase activity slowly   Complete by: As directed ?  ? ?  ? ?Allergies as of 06/24/2021   ? ?   Reactions  ? Lisinopril Cough  ? ?  ? ?  ?Medication List  ?  ? ?TAKE these medications   ? ?acetaminophen 500 MG tablet ?Commonly known as: TYLENOL ?Take 1,000 mg by mouth every 6 (six) hours as needed for mild pain. ?  ?amitriptyline 25 MG tablet ?Commonly known as: ELAVIL ?Take 1 tablet (25 mg total) by mouth at bedtime. ?  ?amLODipine 5 MG tablet ?Commonly known as: NORVASC ?Take 5 mg by mouth daily. ?  ?aspirin EC 81 MG tablet ?Take 81 mg by mouth daily. Swallow whole. ?  ?atorvastatin 80 MG tablet ?Commonly known as: LIPITOR ?Take 80 mg by mouth at bedtime. ?  ?cloNIDine 0.1 MG tablet ?Commonly known as: CATAPRES ?Take 0.1 mg by mouth at bedtime. ?  ?fenofibrate 160 MG tablet ?Take 160 mg by mouth daily. ?  ?levothyroxine 88 MCG tablet ?Commonly known as: SYNTHROID ?Take 88 mcg by mouth daily before breakfast. ?  ?olmesartan-hydrochlorothiazide 40-12.5 MG tablet ?Commonly known as: BENICAR HCT ?Take 1 tablet by mouth daily. ?  ?Pepcid AC Maximum Strength 20 MG tablet ?Generic drug: famotidine ?Take 20 mg by mouth daily. ?  ?pioglitazone 45 MG tablet ?Commonly known as:  ACTOS ?Take 45 mg by mouth daily. ?  ?promethazine 12.5 MG tablet ?Commonly known as: PHENERGAN ?Take 1 tablet (12.5 mg total) by mouth every 6 (six) hours as needed for up to 7 days for refractory nausea / vomiting. ?  ?sertraline 100 MG tablet ?Commonly known as: ZOLOFT ?Take 100 mg by mouth daily. ?  ?valACYclovir 500 MG tablet ?Commonly known as: VALTREX ?Take 1 tablet (500 mg total) by mouth 3 (three) times daily for 7 days. ?What changed:  ?medication strength ?how much to take ?  ? ?  ? ? ?Allergies  ?Allergen Reactions  ? Lisinopril Cough  ? ? ?Consultations: ?None ? ? ?Procedures/Studies: ?DG Chest 1 View ? ?Result Date: 06/20/2021 ?CLINICAL DATA:  Syncope EXAM: CHEST  1 VIEW COMPARISON:  07/08/2020 and prior radiographs FINDINGS: Cardiomegaly again noted. RIGHT hemidiaphragm elevation is again identified. There is no evidence of focal airspace disease, pulmonary edema, suspicious pulmonary nodule/mass, pleural effusion, or pneumothorax. No acute bony abnormalities are identified. IMPRESSION: Cardiomegaly without evidence of acute cardiopulmonary disease. Electronically Signed   By: Margarette Canada M.D.   On: 06/20/2021 19:14  ? ?CT Head  Wo Contrast ? ?Result Date: 06/20/2021 ?CLINICAL DATA:  Syncope. EXAM: CT HEAD WITHOUT CONTRAST TECHNIQUE: Contiguous axial images were obtained from the base of the skull through the vertex without intravenous contrast. RADIATION DOSE REDUCTION: This exam was performed according to the departmental dose-optimization program which includes automated exposure control, adjustment of the mA and/or kV according to patient size and/or use of iterative reconstruction technique. COMPARISON:  03/29/2007 CT FINDINGS: Brain: No evidence of acute infarction, hemorrhage, hydrocephalus, extra-axial collection or mass lesion/mass effect. Atrophy and chronic small-vessel white matter ischemic changes again noted. Vascular: Carotid and vertebral atherosclerotic calcifications are noted. Skull: No  acute abnormality. Sinuses/Orbits: No acute abnormality Other: None. IMPRESSION: 1. No evidence of acute intracranial abnormality. 2. Atrophy and chronic small-vessel white matter ischemic changes. Elec

## 2021-06-24 NOTE — Progress Notes (Signed)
Patients chart stated that she had home medications that needed to be returned to patient, there was no evidence of medication ever being confiscated via pharmacy medication sheet. Consulted with patient, she stated she never brought medication into hospital. Patient was taught education and IV was removed, patient ready for discharge. ? ?

## 2021-06-24 NOTE — Plan of Care (Signed)

## 2021-06-24 NOTE — Progress Notes (Signed)
Patient adequate for discharge but has shingles unfit for discharge lounge. ?

## 2021-06-24 NOTE — Progress Notes (Signed)
Mobility Specialist Progress Note  ? ? 06/24/21 1011  ?Mobility  ?Activity Ambulated with assistance in room  ?Level of Assistance Standby assist, set-up cues, supervision of patient - no hands on  ?Assistive Device Front wheel walker  ?Distance Ambulated (ft) 150 ft  ?Activity Response Tolerated well  ?$Mobility charge 1 Mobility  ? ?Pt received in chair and agreeable. No complaints on walk. Returned to chair with call bell in reach.   ? ?Hildred Alamin ?Mobility Specialist  ?Primary: 5N M.S. Phone: [phone] ?Secondary: 6N M.S. Phone: [phone] ?  ?

## 2021-06-28 ENCOUNTER — Other Ambulatory Visit: Payer: Medicare Other

## 2021-07-08 DIAGNOSIS — B028 Zoster with other complications: Secondary | ICD-10-CM | POA: Diagnosis not present

## 2021-07-21 DIAGNOSIS — B028 Zoster with other complications: Secondary | ICD-10-CM | POA: Diagnosis not present

## 2021-07-27 DIAGNOSIS — M25512 Pain in left shoulder: Secondary | ICD-10-CM | POA: Diagnosis not present

## 2021-07-27 DIAGNOSIS — B028 Zoster with other complications: Secondary | ICD-10-CM | POA: Diagnosis not present

## 2021-07-27 DIAGNOSIS — R609 Edema, unspecified: Secondary | ICD-10-CM | POA: Diagnosis not present

## 2021-08-10 ENCOUNTER — Telehealth: Payer: Self-pay | Admitting: Cardiology

## 2021-08-10 ENCOUNTER — Telehealth: Payer: Self-pay

## 2021-08-10 DIAGNOSIS — E038 Other specified hypothyroidism: Secondary | ICD-10-CM | POA: Diagnosis not present

## 2021-08-10 DIAGNOSIS — E612 Magnesium deficiency: Secondary | ICD-10-CM | POA: Diagnosis not present

## 2021-08-10 DIAGNOSIS — I10 Essential (primary) hypertension: Secondary | ICD-10-CM | POA: Diagnosis not present

## 2021-08-10 DIAGNOSIS — E1169 Type 2 diabetes mellitus with other specified complication: Secondary | ICD-10-CM | POA: Diagnosis not present

## 2021-08-10 DIAGNOSIS — R42 Dizziness and giddiness: Secondary | ICD-10-CM | POA: Diagnosis not present

## 2021-08-10 DIAGNOSIS — R600 Localized edema: Secondary | ICD-10-CM | POA: Diagnosis not present

## 2021-08-10 DIAGNOSIS — R079 Chest pain, unspecified: Secondary | ICD-10-CM | POA: Diagnosis not present

## 2021-08-10 DIAGNOSIS — J90 Pleural effusion, not elsewhere classified: Secondary | ICD-10-CM | POA: Diagnosis not present

## 2021-08-10 DIAGNOSIS — R0602 Shortness of breath: Secondary | ICD-10-CM | POA: Diagnosis not present

## 2021-08-10 NOTE — Telephone Encounter (Signed)
Pt c/o Shortness Of Breath: STAT if SOB developed within the last 24 hours or pt is noticeably SOB on the phone  1. Are you currently SOB (can you hear that pt is SOB on the phone)? no  2. How long have you been experiencing SOB? 2-3 weeks    3. Are you SOB when sitting or when up moving around? Both   4. Are you currently experiencing any other symptoms? Dizziness, no appetite, having trouble walking, legs are hard and swollen

## 2021-08-10 NOTE — Telephone Encounter (Signed)
Pt called stating she has had shingles and is having issues with swelling in her legs, soreness in her face and around her eyes and some shortness of breath. Encouraged pt to see her PCP today or in the morning. If she continues to need to see cardiology will make appt for pt to be worked in. Pt agreed and verbalized understanding. She had no furtherer question or concerns,.

## 2021-08-12 DIAGNOSIS — E612 Magnesium deficiency: Secondary | ICD-10-CM | POA: Diagnosis not present

## 2021-08-12 DIAGNOSIS — I5031 Acute diastolic (congestive) heart failure: Secondary | ICD-10-CM | POA: Diagnosis not present

## 2021-08-19 DIAGNOSIS — I5032 Chronic diastolic (congestive) heart failure: Secondary | ICD-10-CM | POA: Diagnosis not present

## 2021-08-19 DIAGNOSIS — I10 Essential (primary) hypertension: Secondary | ICD-10-CM | POA: Diagnosis not present

## 2021-08-19 DIAGNOSIS — E612 Magnesium deficiency: Secondary | ICD-10-CM | POA: Diagnosis not present

## 2021-09-20 DIAGNOSIS — I5032 Chronic diastolic (congestive) heart failure: Secondary | ICD-10-CM | POA: Diagnosis not present

## 2021-09-20 DIAGNOSIS — E1169 Type 2 diabetes mellitus with other specified complication: Secondary | ICD-10-CM | POA: Diagnosis not present

## 2021-09-20 DIAGNOSIS — I10 Essential (primary) hypertension: Secondary | ICD-10-CM | POA: Diagnosis not present

## 2021-09-20 DIAGNOSIS — E612 Magnesium deficiency: Secondary | ICD-10-CM | POA: Diagnosis not present

## 2021-09-22 ENCOUNTER — Ambulatory Visit: Payer: Medicare Other | Admitting: Cardiology

## 2021-09-22 VITALS — BP 130/62 | HR 57 | Ht 63.0 in | Wt 142.0 lb

## 2021-09-22 DIAGNOSIS — Z01818 Encounter for other preprocedural examination: Secondary | ICD-10-CM

## 2021-09-22 DIAGNOSIS — E785 Hyperlipidemia, unspecified: Secondary | ICD-10-CM | POA: Diagnosis not present

## 2021-09-22 DIAGNOSIS — R55 Syncope and collapse: Secondary | ICD-10-CM | POA: Diagnosis not present

## 2021-09-22 DIAGNOSIS — E118 Type 2 diabetes mellitus with unspecified complications: Secondary | ICD-10-CM

## 2021-09-22 DIAGNOSIS — R0609 Other forms of dyspnea: Secondary | ICD-10-CM | POA: Diagnosis not present

## 2021-09-22 DIAGNOSIS — I10 Essential (primary) hypertension: Secondary | ICD-10-CM

## 2021-09-22 NOTE — Progress Notes (Signed)
Cardiology Office Note:    Date:  09/22/2021   ID:  Sherry Herrera, DOB 10/18/1955, MRN [id number]  PCP:  Greig Right, MD  Cardiologist:  Jenne Campus, MD    Referring MD: Greig Right, MD   Chief Complaint  Patient presents with   Leg Swelling   Shortness of Breath         History of Present Illness:    Sherry Herrera is a 66 y.o. female I did see her a few weeks ago when she came to [place] to be evaluated before elective shoulder surgery.  She was getting ready to have surgery after we did stress test which was negative echocardiogram which showed preserved left ventricle ejection fraction and then she developed shingles it was quite profound and let her to have swelling of her face as well as lower extremities.  Eventually she end up having episode of syncope that brought her to the hospital she was rehydrated and doing quite well right now.  Still having some difficulty recovering still residual pain in the place when she gets shingles shingles involve right side of her face.  Denies have any cardiac complaints  Past Medical History:  Diagnosis Date   Calculus of gallbladder with chronic cholecystitis without obstruction 11/18/2020   Chronic renal failure    Essential (primary) hypertension    GERD (gastroesophageal reflux disease)    Hypothyroidism    Obesity    Osteoarthritis    Right Shoulder   Pure hypercholesterolemia    Transient ischemic attack 2006   left arm numbness; never confirmed. CT and MRI negative   Type 2 diabetes mellitus [employer] [facility])     Past Surgical History:  Procedure Laterality Date   CHOLECYSTECTOMY  11/2020   COLON RESECTION  08/2010   Partial colon resection-transvers colon for tubular adenoma   FOOT SURGERY Bilateral    Had small benign tumors removed   TUBAL LIGATION      Current Medications: Current Meds  Medication Sig   aspirin EC 81 MG tablet Take 81 mg by mouth daily. Swallow whole.   atorvastatin (LIPITOR) 80 MG tablet Take 80 mg  by mouth at bedtime.   cloNIDine (CATAPRES) 0.1 MG tablet Take 0.1 mg by mouth at bedtime.   dapagliflozin propanediol (FARXIGA) 10 MG TABS tablet Take 10 mg by mouth in the morning.   fenofibrate 160 MG tablet Take 160 mg by mouth daily.   furosemide (LASIX) 20 MG tablet Take 20 mg by mouth 2 (two) times daily.   hydrOXYzine (ATARAX) 25 MG tablet Take 25-50 mg by mouth 3 (three) times daily as needed for anxiety.   levothyroxine (SYNTHROID) 88 MCG tablet Take 88 mcg by mouth daily before breakfast.   magnesium oxide (MAG-OX) 400 (240 Mg) MG tablet Take 400 mg by mouth 2 (two) times daily.   meclizine (ANTIVERT) 25 MG tablet Take 25 mg by mouth 3 (three) times daily as needed for dizziness or nausea.   nebivolol (BYSTOLIC) 5 MG tablet Take 5 mg by mouth daily.   olmesartan-hydrochlorothiazide (BENICAR HCT) 40-12.5 MG tablet Take 1 tablet by mouth daily.   omeprazole (PRILOSEC) 20 MG capsule Take 20 mg by mouth daily.   potassium chloride (KLOR-CON M) 10 MEQ tablet Take 20 mEq by mouth daily.   sertraline (ZOLOFT) 100 MG tablet Take 100 mg by mouth daily.   triamcinolone cream (KENALOG) 0.1 % Apply 1 Application topically 2 (two) times daily.     Allergies:   Lisinopril   Social History  Socioeconomic History   Marital status: Widowed    Spouse name: Not on file   Number of children: Not on file   Years of education: Not on file   Highest education level: Not on file  Occupational History   Not on file  Tobacco Use   Smoking status: Never   Smokeless tobacco: Never  Substance and Sexual Activity   Alcohol use: Never   Drug use: Not on file   Sexual activity: Not on file  Other Topics Concern   Not on file  Social History Narrative   Not on file   Social Determinants of Health   Financial Resource Strain: Not on file  Food Insecurity: Not on file  Transportation Needs: Not on file  Physical Activity: Not on file  Stress: Not on file  Social Connections: Not on file      Family History: The patient's family history includes Diabetes in her mother; Heart attack in her father; Hyperlipidemia in her sister and sister; Hypertension in her brother, mother, sister, and sister; Kidney failure in her mother; Osteoarthritis in her mother; Stroke in her mother. ROS:   Please see the history of present illness.    All 14 point review of systems negative except as described per history of present illness  EKGs/Labs/Other Studies Reviewed:      Recent Labs: 06/20/2021: TSH 1.969 06/21/2021: ALT 33; Magnesium 2.3 06/23/2021: BUN 24; Creatinine, Ser 1.17; Hemoglobin 9.4; Platelets 286; Potassium 3.9; Sodium 137  Recent Lipid Panel    Component Value Date/Time   CHOL (H) 03/30/2007 0845    234        ATP III CLASSIFICATION:  <200     mg/dL   Desirable  200-239  mg/dL   Borderline High  >=240    mg/dL   High   TRIG 171 (H) 03/30/2007 0845   HDL 38 (L) 03/30/2007 0845   CHOLHDL 6.2 03/30/2007 0845   VLDL 34 03/30/2007 0845   LDLCALC (H) 03/30/2007 0845    162        Total Cholesterol/HDL:CHD Risk Coronary Heart Disease Risk Table                     Men   Women  1/2 Average Risk   3.4   3.3    Physical Exam:    VS:  BP 130/62 (BP Location: Left Arm, Patient Position: Sitting)   Pulse (!) 57   Ht '5\' 3"'$  (1.6 m)   Wt 142 lb (64.4 kg)   SpO2 97%   BMI 25.15 kg/m     Wt Readings from Last 3 Encounters:  09/22/21 142 lb (64.4 kg)  06/24/21 166 lb 14.2 oz (75.7 kg)  06/15/21 161 lb 3.2 oz (73.1 kg)     GEN:  Well nourished, well developed in no acute distress HEENT: Normal NECK: No JVD; No carotid bruits LYMPHATICS: No lymphadenopathy CARDIAC: RRR, no murmurs, no rubs, no gallops RESPIRATORY:  Clear to auscultation without rales, wheezing or rhonchi  ABDOMEN: Soft, non-tender, non-distended MUSCULOSKELETAL:  No edema; No deformity  SKIN: Warm and dry LOWER EXTREMITIES: no swelling NEUROLOGIC:  Alert and oriented x 3 PSYCHIATRIC:  Normal affect    ASSESSMENT:    1. Syncope and collapse   2. Preoperative clearance   3. Essential hypertension   4. Type 2 diabetes mellitus with complication, without long-term current use of insulin ([place])   5. Dyspnea on exertion   6. Dyslipidemia    PLAN:  In order of problems listed above:  Syncope and collapse that was related to dehydration when she was sick with shingles and taking a lot of medications. Cardiovascular preop evaluation.  She is pausing right now with surgery but she will be scheduled to have [place] as well as carotic ultrasounds. Essential hypertension blood pressure well-controlled. Dyspnea on exertion again a stress test will be done I did review record from hospital for this visit   Medication Adjustments/Labs and Tests Ordered: Current medicines are reviewed at length with the patient today.  Concerns regarding medicines are outlined above.  Orders Placed This Encounter  Procedures   MYOCARDIAL PERFUSION IMAGING   VAS US CAROTID   Medication changes: No orders of the defined types were placed in this encounter.   Signed, Park Liter, MD, [facility] 09/22/2021 4:52 PM    [place]

## 2021-09-22 NOTE — Patient Instructions (Signed)
Medication Instructions:  Your physician recommends that you continue on your current medications as directed. Please refer to the Current Medication list given to you today.  *If you need a refill on your cardiac medications before your next appointment, please call your pharmacy*   Lab Work: NONE If you have labs (blood work) drawn today and your tests are completely normal, you will receive your results only by: [place] (if you have MyChart) OR A paper copy in the mail If you have any lab test that is abnormal or we need to change your treatment, we will call you to review the results.   Testing/Procedures: Your physician has requested that you have a carotid duplex. This test is an ultrasound of the carotid arteries in your neck. It looks at blood flow through these arteries that supply the brain with blood. Allow one hour for this exam. There are no restrictions or special instructions.   Your physician has requested that you have a lexiscan myoview. For further information please visit [email or web address]. Please follow instruction sheet, as given.   The test will take approximately 3 to 4 hours to complete; you may bring reading material.  If someone comes with you to your appointment, they will need to remain in the main lobby due to limited space in the testing area. **If you are pregnant or breastfeeding, please notify the nuclear lab prior to your appointment**  How to prepare for your Myocardial Perfusion Test: Do not eat or drink 3 hours prior to your test, except you may have water. Do not consume products containing caffeine (regular or decaffeinated) 12 hours prior to your test. (ex: coffee, chocolate, sodas, tea). Do bring a list of your current medications with you.  If not listed below, you may take your medications as normal. Do wear comfortable clothes (no dresses or overalls) and walking shoes, tennis shoes preferred (No heels or open toe shoes are  allowed). Do NOT wear cologne, perfume, aftershave, or lotions (deodorant is allowed). If these instructions are not followed, your test will have to be rescheduled.    Follow-Up: At [facility], you and your health needs are our priority.  As part of our continuing mission to provide you with exceptional heart care, we have created designated Provider Care Teams.  These Care Teams include your primary Cardiologist (physician) and Advanced Practice Providers (APPs -  Physician Assistants and Nurse Practitioners) who all work together to provide you with the care you need, when you need it.  We recommend signing up for the patient portal called "MyChart".  Sign up information is provided on this After Visit Summary.  MyChart is used to connect with patients for Virtual Visits (Telemedicine).  Patients are able to view lab/test results, encounter notes, upcoming appointments, etc.  Non-urgent messages can be sent to your provider as well.   To learn more about what you can do with MyChart, go to [email or web address].    Your next appointment:   5 month(s)  The format for your next appointment:   In Person  Provider:   Jenne Campus, MD    Other Instructions   Important Information About Sugar

## 2021-09-23 ENCOUNTER — Telehealth (HOSPITAL_COMMUNITY): Payer: Self-pay | Admitting: *Deleted

## 2021-09-23 NOTE — Telephone Encounter (Signed)
Left message on voicemail per DPR in reference to upcoming appointment scheduled on 09/30/2021 at 8:15 with detailed instructions given per Myocardial Perfusion Study Information Sheet for the test. LM to arrive 15 minutes early, and that it is imperative to arrive on time for appointment to keep from having the test rescheduled. If you need to cancel or reschedule your appointment, please call the office within 24 hours of your appointment. Failure to do so may result in a cancellation of your appointment, and a $50 no show fee. Phone number given for call back for any questions.

## 2021-09-30 ENCOUNTER — Ambulatory Visit (INDEPENDENT_AMBULATORY_CARE_PROVIDER_SITE_OTHER): Payer: Medicare Other

## 2021-09-30 DIAGNOSIS — Z01818 Encounter for other preprocedural examination: Secondary | ICD-10-CM | POA: Diagnosis not present

## 2021-09-30 DIAGNOSIS — R55 Syncope and collapse: Secondary | ICD-10-CM

## 2021-09-30 LAB — MYOCARDIAL PERFUSION IMAGING
LV dias vol: 128 mL (ref 46–106)
LV sys vol: 63 mL
Nuc Stress EF: 51 %
Peak HR: 70 {beats}/min
Rest HR: 48 {beats}/min
Rest Nuclear Isotope Dose: 10.8 mCi
SDS: 7
SRS: 9
SSS: 16
Stress Nuclear Isotope Dose: 32.6 mCi
TID: 1.16

## 2021-09-30 MED ORDER — TECHNETIUM TC 99M TETROFOSMIN IV KIT
10.8000 | PACK | Freq: Once | INTRAVENOUS | Status: AC | PRN
  Administered 2021-09-30: 10.8 via INTRAVENOUS

## 2021-09-30 MED ORDER — REGADENOSON 0.4 MG/5ML IV SOLN
0.4000 mg | Freq: Once | INTRAVENOUS | Status: AC
  Administered 2021-09-30: 0.4 mg via INTRAVENOUS

## 2021-09-30 MED ORDER — TECHNETIUM TC 99M TETROFOSMIN IV KIT
32.6000 | PACK | Freq: Once | INTRAVENOUS | Status: AC | PRN
  Administered 2021-09-30: 32.6 via INTRAVENOUS

## 2021-10-04 ENCOUNTER — Telehealth: Payer: Self-pay

## 2021-10-04 NOTE — Telephone Encounter (Signed)
Patient notified of results and schedule on 09/11 to discuss results.

## 2021-10-04 NOTE — Telephone Encounter (Signed)
-----   Message from Park Liter, MD sent at 10/01/2021  8:46 AM EDT ----- Abnormal stress test.  She is to have a discussion about options within the next week or 2

## 2021-10-05 ENCOUNTER — Ambulatory Visit (INDEPENDENT_AMBULATORY_CARE_PROVIDER_SITE_OTHER): Payer: Medicare Other

## 2021-10-05 DIAGNOSIS — R55 Syncope and collapse: Secondary | ICD-10-CM | POA: Diagnosis not present

## 2021-10-06 DIAGNOSIS — M19012 Primary osteoarthritis, left shoulder: Secondary | ICD-10-CM | POA: Diagnosis not present

## 2021-10-07 ENCOUNTER — Telehealth: Payer: Self-pay

## 2021-10-07 NOTE — Telephone Encounter (Signed)
Patient notified of results.

## 2021-10-07 NOTE — Telephone Encounter (Signed)
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

## 2021-10-25 ENCOUNTER — Encounter: Payer: Self-pay | Admitting: Cardiology

## 2021-10-25 ENCOUNTER — Ambulatory Visit: Payer: Medicare Other | Attending: Cardiology | Admitting: Cardiology

## 2021-10-25 VITALS — BP 138/62 | HR 97 | Ht 63.0 in | Wt 135.4 lb

## 2021-10-25 DIAGNOSIS — I10 Essential (primary) hypertension: Secondary | ICD-10-CM | POA: Diagnosis not present

## 2021-10-25 DIAGNOSIS — E118 Type 2 diabetes mellitus with unspecified complications: Secondary | ICD-10-CM | POA: Diagnosis not present

## 2021-10-25 DIAGNOSIS — E785 Hyperlipidemia, unspecified: Secondary | ICD-10-CM

## 2021-10-25 DIAGNOSIS — R9439 Abnormal result of other cardiovascular function study: Secondary | ICD-10-CM

## 2021-10-25 DIAGNOSIS — R0609 Other forms of dyspnea: Secondary | ICD-10-CM

## 2021-10-25 HISTORY — DX: Abnormal result of other cardiovascular function study: R94.39

## 2021-10-25 MED ORDER — RANOLAZINE ER 500 MG PO TB12: 500.0000 mg | ORAL_TABLET | Freq: Two times a day (BID) | ORAL | 3 refills | Status: DC

## 2021-10-25 NOTE — Progress Notes (Signed)
Cardiology Office Note:    Date:  10/25/2021   ID:  Sherry Herrera, DOB [birth year], MRN [id number]  PCP:  Greig Right, MD  Cardiologist:  Jenne Campus, MD    Referring MD: Greig Right, MD   Chief Complaint  Patient presents with   Results   Weight Loss    History of Present Illness:    Sherry Herrera is a 66 y.o. female with past medical history significant for essential hypertension, dyslipidemia, history of TIA, history of syncope, history of shingles.  She does have some chronic shoulder problem and does some issue about potentially having surgery done.  Then she ended up having shingles on her face.  She is getting better but still a lot of complaint.  Since she still contemplated surgery on the shoulder I did stress test on her.  Surprisingly stress to show ischemia involving LAD territory.  I brought her today to my office to talk about this.  She described to have fatigue tiredness however no typical chest pain tightness squeezing pressure mid chest.  Past Medical History:  Diagnosis Date   Calculus of gallbladder with chronic cholecystitis without obstruction 11/18/2020   Chronic renal failure    Essential (primary) hypertension    GERD (gastroesophageal reflux disease)    Hypothyroidism    Obesity    Osteoarthritis    Right Shoulder   Pure hypercholesterolemia    Transient ischemic attack 2006   left arm numbness; never confirmed. CT and MRI negative   Type 2 diabetes mellitus [facility])     Past Surgical History:  Procedure Laterality Date   CHOLECYSTECTOMY  11/2020   COLON RESECTION  08/2010   Partial colon resection-transvers colon for tubular adenoma   FOOT SURGERY Bilateral    Had small benign tumors removed   TUBAL LIGATION      Current Medications: Current Meds  Medication Sig   aspirin EC 81 MG tablet Take 81 mg by mouth daily. Swallow whole.   atorvastatin (LIPITOR) 80 MG tablet Take 80 mg by mouth at bedtime.   cloNIDine (CATAPRES) 0.1 MG  tablet Take 0.1 mg by mouth at bedtime.   dapagliflozin propanediol (FARXIGA) 10 MG TABS tablet Take 10 mg by mouth in the morning.   fenofibrate 160 MG tablet Take 160 mg by mouth daily.   furosemide (LASIX) 20 MG tablet Take 20 mg by mouth 2 (two) times daily.   hydrOXYzine (ATARAX) 25 MG tablet Take 25-50 mg by mouth 3 (three) times daily as needed for anxiety.   levothyroxine (SYNTHROID) 88 MCG tablet Take 88 mcg by mouth daily before breakfast.   magnesium oxide (MAG-OX) 400 (240 Mg) MG tablet Take 400 mg by mouth 2 (two) times daily.   meclizine (ANTIVERT) 25 MG tablet Take 25 mg by mouth 3 (three) times daily as needed for dizziness or nausea.   nebivolol (BYSTOLIC) 5 MG tablet Take 5 mg by mouth daily.   olmesartan-hydrochlorothiazide (BENICAR HCT) 40-12.5 MG tablet Take 1 tablet by mouth daily.   omeprazole (PRILOSEC) 20 MG capsule Take 20 mg by mouth daily.   potassium chloride (KLOR-CON M) 10 MEQ tablet Take 20 mEq by mouth daily.   sertraline (ZOLOFT) 100 MG tablet Take 100 mg by mouth daily.   triamcinolone cream (KENALOG) 0.1 % Apply 1 Application topically 2 (two) times daily.     Allergies:   Lisinopril   Social History   Socioeconomic History   Marital status: Widowed    Spouse name: Not on file  Number of children: Not on file   Years of education: Not on file   Highest education level: Not on file  Occupational History   Not on file  Tobacco Use   Smoking status: Never   Smokeless tobacco: Never  Substance and Sexual Activity   Alcohol use: Never   Drug use: Not on file   Sexual activity: Not on file  Other Topics Concern   Not on file  Social History Narrative   Not on file   Social Determinants of Health   Financial Resource Strain: Not on file  Food Insecurity: Not on file  Transportation Needs: Not on file  Physical Activity: Not on file  Stress: Not on file  Social Connections: Not on file     Family History: The patient's family history  includes Diabetes in her mother; Heart attack in her father; Hyperlipidemia in her sister and sister; Hypertension in her brother, mother, sister, and sister; Kidney failure in her mother; Osteoarthritis in her mother; Stroke in her mother. ROS:   Please see the history of present illness.    All 14 point review of systems negative except as described per history of present illness  EKGs/Labs/Other Studies Reviewed:      Recent Labs: 06/20/2021: TSH 1.969 06/21/2021: ALT 33; Magnesium 2.3 06/23/2021: BUN 24; Creatinine, Ser 1.17; Hemoglobin 9.4; Platelets 286; Potassium 3.9; Sodium 137  Recent Lipid Panel    Component Value Date/Time   CHOL (H) 03/30/2007 0845    234        ATP III CLASSIFICATION:  <200     mg/dL   Desirable  200-239  mg/dL   Borderline High  >=240    mg/dL   High   TRIG 171 (H) 03/30/2007 0845   HDL 38 (L) 03/30/2007 0845   CHOLHDL 6.2 03/30/2007 0845   VLDL 34 03/30/2007 0845   LDLCALC (H) 03/30/2007 0845    162        Total Cholesterol/HDL:CHD Risk Coronary Heart Disease Risk Table                     Men   Women  1/2 Average Risk   3.4   3.3    Physical Exam:    VS:  BP 138/62 (BP Location: Left Arm, Patient Position: Sitting)   Pulse 97   Ht '5\' 3"'$  (1.6 m)   Wt 135 lb 6.4 oz (61.4 kg)   SpO2 97%   BMI 23.99 kg/m     Wt Readings from Last 3 Encounters:  10/25/21 135 lb 6.4 oz (61.4 kg)  09/30/21 142 lb (64.4 kg)  09/22/21 142 lb (64.4 kg)     GEN:  Well nourished, well developed in no acute distress HEENT: Normal NECK: No JVD; No carotid bruits LYMPHATICS: No lymphadenopathy CARDIAC: RRR, no murmurs, no rubs, no gallops RESPIRATORY:  Clear to auscultation without rales, wheezing or rhonchi  ABDOMEN: Soft, non-tender, non-distended MUSCULOSKELETAL:  No edema; No deformity  SKIN: Warm and dry LOWER EXTREMITIES: no swelling NEUROLOGIC:  Alert and oriented x 3 PSYCHIATRIC:  Normal affect   ASSESSMENT:    1. Essential hypertension   2.  Abnormal stress test ischemia involving anterior wall   3. Type 2 diabetes mellitus with complication, without long-term current use of insulin ([place])   4. Dyspnea on exertion   5. Dyslipidemia    PLAN:    In order of problems listed above:  Essential hypertension blood pressure seems to well controlled continue  present management. Abnormal stress test I had a long discussion with her regarding options for this situation with talking about doing cardiac catheterization versus medical therapy clearly without hesitation she prefers medical therapy.  We discussed pros and cons of this approach.  She would like to try medications.  Therefore, I will ask her to start taking ranolazine 500 mg twice daily see how that goes. Dyslipidemia I did review her K PN which show me her LDL of 93 and HDL 31.  She is taking high intense statin in form of Lipitor 80 which I will continue.  However her cholesterol is clearly not well controlled.  We will add Zetia 10 to her medical regimen. Dyspnea on exertion could be angina equivalent.  Discussion as above   Medication Adjustments/Labs and Tests Ordered: Current medicines are reviewed at length with the patient today.  Concerns regarding medicines are outlined above.  No orders of the defined types were placed in this encounter.  Medication changes: No orders of the defined types were placed in this encounter.   Signed, Park Liter, MD, [facility] 10/25/2021 10:17 AM    [place]

## 2021-10-25 NOTE — Patient Instructions (Signed)
Medication Instructions:  Your physician has recommended you make the following change in your medication:   START: Ranolazine '500mg'$  1 tablet twice daily by mouth    Lab Work: None Ordered If you have labs (blood work) drawn today and your tests are completely normal, you will receive your results only by: [place] (if you have MyChart) OR A paper copy in the mail If you have any lab test that is abnormal or we need to change your treatment, we will call you to review the results.   Testing/Procedures: None Ordered   Follow-Up: At [facility], you and your health needs are our priority.  As part of our continuing mission to provide you with exceptional heart care, we have created designated Provider Care Teams.  These Care Teams include your primary Cardiologist (physician) and Advanced Practice Providers (APPs -  Physician Assistants and Nurse Practitioners) who all work together to provide you with the care you need, when you need it.  We recommend signing up for the patient portal called "MyChart".  Sign up information is provided on this After Visit Summary.  MyChart is used to connect with patients for Virtual Visits (Telemedicine).  Patients are able to view lab/test results, encounter notes, upcoming appointments, etc.  Non-urgent messages can be sent to your provider as well.   To learn more about what you can do with MyChart, go to [email or web address].    Your next appointment:   3 month(s)  The format for your next appointment:   In Person  Provider:   Jenne Campus, MD    Other Instructions NA

## 2021-10-25 NOTE — Addendum Note (Signed)
Addended by: Jacobo Forest D on: 10/25/2021 10:40 AM   Modules accepted: Orders

## 2021-11-29 DIAGNOSIS — M19012 Primary osteoarthritis, left shoulder: Secondary | ICD-10-CM | POA: Diagnosis not present

## 2021-12-13 DIAGNOSIS — E038 Other specified hypothyroidism: Secondary | ICD-10-CM | POA: Diagnosis not present

## 2021-12-13 DIAGNOSIS — E7800 Pure hypercholesterolemia, unspecified: Secondary | ICD-10-CM | POA: Diagnosis not present

## 2021-12-13 DIAGNOSIS — Z2821 Immunization not carried out because of patient refusal: Secondary | ICD-10-CM | POA: Diagnosis not present

## 2021-12-13 DIAGNOSIS — E1169 Type 2 diabetes mellitus with other specified complication: Secondary | ICD-10-CM | POA: Diagnosis not present

## 2021-12-13 DIAGNOSIS — I739 Peripheral vascular disease, unspecified: Secondary | ICD-10-CM | POA: Diagnosis not present

## 2021-12-13 DIAGNOSIS — Z0000 Encounter for general adult medical examination without abnormal findings: Secondary | ICD-10-CM | POA: Diagnosis not present

## 2021-12-13 DIAGNOSIS — N1832 Chronic kidney disease, stage 3b: Secondary | ICD-10-CM | POA: Diagnosis not present

## 2021-12-13 DIAGNOSIS — R634 Abnormal weight loss: Secondary | ICD-10-CM | POA: Diagnosis not present

## 2021-12-13 DIAGNOSIS — I10 Essential (primary) hypertension: Secondary | ICD-10-CM | POA: Diagnosis not present

## 2021-12-13 DIAGNOSIS — Z6823 Body mass index (BMI) 23.0-23.9, adult: Secondary | ICD-10-CM | POA: Diagnosis not present

## 2022-01-12 DIAGNOSIS — Z1231 Encounter for screening mammogram for malignant neoplasm of breast: Secondary | ICD-10-CM | POA: Diagnosis not present

## 2022-01-12 DIAGNOSIS — Z87891 Personal history of nicotine dependence: Secondary | ICD-10-CM | POA: Diagnosis not present

## 2022-01-12 DIAGNOSIS — I739 Peripheral vascular disease, unspecified: Secondary | ICD-10-CM | POA: Diagnosis not present

## 2022-01-12 DIAGNOSIS — Z9889 Other specified postprocedural states: Secondary | ICD-10-CM | POA: Diagnosis not present

## 2022-01-12 DIAGNOSIS — I10 Essential (primary) hypertension: Secondary | ICD-10-CM | POA: Diagnosis not present

## 2022-01-12 DIAGNOSIS — I700 Atherosclerosis of aorta: Secondary | ICD-10-CM | POA: Diagnosis not present

## 2022-01-12 DIAGNOSIS — E119 Type 2 diabetes mellitus without complications: Secondary | ICD-10-CM | POA: Diagnosis not present

## 2022-01-12 DIAGNOSIS — R1900 Intra-abdominal and pelvic swelling, mass and lump, unspecified site: Secondary | ICD-10-CM | POA: Diagnosis not present

## 2022-01-12 DIAGNOSIS — E785 Hyperlipidemia, unspecified: Secondary | ICD-10-CM | POA: Diagnosis not present

## 2022-01-20 ENCOUNTER — Other Ambulatory Visit: Payer: Self-pay

## 2022-01-24 ENCOUNTER — Ambulatory Visit: Payer: Medicare Other | Attending: Cardiology | Admitting: Cardiology

## 2022-01-24 ENCOUNTER — Encounter: Payer: Self-pay | Admitting: Cardiology

## 2022-01-24 VITALS — BP 134/62 | HR 50 | Ht 63.0 in | Wt 131.6 lb

## 2022-01-24 DIAGNOSIS — R9439 Abnormal result of other cardiovascular function study: Secondary | ICD-10-CM | POA: Diagnosis not present

## 2022-01-24 DIAGNOSIS — E118 Type 2 diabetes mellitus with unspecified complications: Secondary | ICD-10-CM

## 2022-01-24 DIAGNOSIS — E785 Hyperlipidemia, unspecified: Secondary | ICD-10-CM | POA: Diagnosis not present

## 2022-01-24 DIAGNOSIS — I10 Essential (primary) hypertension: Secondary | ICD-10-CM

## 2022-01-24 MED ORDER — ISOSORBIDE MONONITRATE ER 30 MG PO TB24: 30.0000 mg | ORAL_TABLET | Freq: Every day | ORAL | 3 refills | Status: DC

## 2022-01-24 NOTE — Progress Notes (Signed)
Cardiology Office Note:    Date:  01/24/2022   ID:  Sherry Herrera, DOB 08/27/1955, MRN [id number]  PCP:  Greig Right, MD  Cardiologist:  Jenne Campus, MD    Referring MD: Greig Right, MD   Chief Complaint  Patient presents with   Follow-up    History of Present Illness:    Sherry Herrera is a 66 y.o. female with past medical history significant for essential hypertension, dyslipidemia, history of TIA, history of syncope, history of shingles.  She does a chronic shoulder problem potential statin about having surgery but that was not done.  As evaluation before surgery she had stress test done which showed surprisingly ischemia in LAD territory after that we had discussion about potentially doing cardiac catheterization however she does not want to do it.  She wants medical conservative approach.  Honestly she is feeling quite well.  She denies have any chest pain tightness squeezing pressure burning chest.  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  Past Medical History:  Diagnosis Date   Calculus of gallbladder with chronic cholecystitis without obstruction 11/18/2020   Chronic renal failure    Essential (primary) hypertension    GERD (gastroesophageal reflux disease)    Hypothyroidism    Obesity    Osteoarthritis    Right Shoulder   Pure hypercholesterolemia    Transient ischemic attack 2006   left arm numbness; never confirmed. CT and MRI negative   Type 2 diabetes mellitus [facility])     Past Surgical History:  Procedure Laterality Date   CHOLECYSTECTOMY  11/2020   COLON RESECTION  08/2010   Partial colon resection-transvers colon for tubular adenoma   FOOT SURGERY Bilateral    Had small benign tumors removed   TUBAL LIGATION      Current Medications: Current Meds  Medication Sig   aspirin EC 81 MG tablet Take 81 mg by mouth daily. Swallow whole.    atorvastatin (LIPITOR) 80 MG tablet Take 80 mg by mouth at bedtime.   cloNIDine (CATAPRES) 0.1 MG tablet Take 0.1 mg by mouth at bedtime.   dapagliflozin propanediol (FARXIGA) 10 MG TABS tablet Take 10 mg by mouth in the morning.   fenofibrate 160 MG tablet Take 160 mg by mouth daily.   furosemide (LASIX) 20 MG tablet Take 20 mg by mouth 2 (two) times daily.   isosorbide mononitrate (IMDUR) 30 MG 24 hr tablet Take 1 tablet (30 mg total) by mouth daily.   levothyroxine (SYNTHROID) 88 MCG tablet Take 88 mcg by mouth daily before breakfast.   magnesium oxide (MAG-OX) 400 (240 Mg) MG tablet Take 400 mg by mouth 2 (two) times daily.   meclizine (ANTIVERT) 25 MG tablet Take 25 mg by mouth 3 (three) times daily as needed for dizziness or nausea.   nebivolol (BYSTOLIC) 5 MG tablet Take 5 mg by mouth daily.   olmesartan-hydrochlorothiazide (BENICAR HCT) 40-12.5 MG tablet Take 1 tablet by mouth daily.   omeprazole (PRILOSEC) 20 MG capsule Take 20 mg by mouth daily.   potassium chloride (KLOR-CON M) 10 MEQ tablet Take 20 mEq by mouth daily.   ranolazine (RANEXA) 500 MG 12 hr tablet Take 1 tablet (500 mg total) by mouth 2 (two) times daily.   sertraline (ZOLOFT) 100 MG tablet Take 100 mg by mouth daily.   [DISCONTINUED] hydrOXYzine (ATARAX) 25 MG tablet Take 25-50 mg by mouth 3 (three) times daily  as needed for anxiety.   [DISCONTINUED] potassium chloride (KLOR-CON) 10 MEQ tablet Take 10 mEq by mouth daily.   [DISCONTINUED] triamcinolone cream (KENALOG) 0.1 % Apply 1 Application topically 2 (two) times daily.     Allergies:   Lisinopril   Social History   Socioeconomic History   Marital status: Widowed    Spouse name: Not on file   Number of children: Not on file   Years of education: Not on file   Highest education level: Not on file  Occupational History   Not on file  Tobacco Use   Smoking status: Never   Smokeless tobacco: Never  Substance and Sexual Activity   Alcohol use: Never   Drug  use: Not on file   Sexual activity: Not on file  Other Topics Concern   Not on file  Social History Narrative   Not on file   Social Determinants of Health   Financial Resource Strain: Not on file  Food Insecurity: Not on file  Transportation Needs: Not on file  Physical Activity: Not on file  Stress: Not on file  Social Connections: Not on file     Family History: The patient's family history includes Diabetes in her mother; Heart attack in her father; Hyperlipidemia in her sister and sister; Hypertension in her brother, mother, sister, and sister; Kidney failure in her mother; Osteoarthritis in her mother; Stroke in her mother. ROS:   Please see the history of present illness.    All 14 point review of systems negative except as described per history of present illness  EKGs/Labs/Other Studies Reviewed:      Recent Labs: 06/20/2021: TSH 1.969 06/21/2021: ALT 33; Magnesium 2.3 06/23/2021: BUN 24; Creatinine, Ser 1.17; Hemoglobin 9.4; Platelets 286; Potassium 3.9; Sodium 137  Recent Lipid Panel    Component Value Date/Time   CHOL (H) 03/30/2007 0845    234        ATP III CLASSIFICATION:  <200     mg/dL   Desirable  200-239  mg/dL   Borderline High  >=240    mg/dL   High   TRIG 171 (H) 03/30/2007 0845   HDL 38 (L) 03/30/2007 0845   CHOLHDL 6.2 03/30/2007 0845   VLDL 34 03/30/2007 0845   LDLCALC (H) 03/30/2007 0845    162        Total Cholesterol/HDL:CHD Risk Coronary Heart Disease Risk Table                     Men   Women  1/2 Average Risk   3.4   3.3    Physical Exam:    VS:  BP 134/62 (BP Location: Left Arm, Patient Position: Sitting)   Pulse (!) 50   Ht '5\' 3"'$  (1.6 m)   Wt 131 lb 9.6 oz (59.7 kg)   SpO2 90%   BMI 23.31 kg/m     Wt Readings from Last 3 Encounters:  01/24/22 131 lb 9.6 oz (59.7 kg)  10/25/21 135 lb 6.4 oz (61.4 kg)  09/30/21 142 lb (64.4 kg)     GEN:  Well nourished, well developed in no acute distress HEENT: Normal NECK: No JVD; No  carotid bruits LYMPHATICS: No lymphadenopathy CARDIAC: RRR, no murmurs, no rubs, no gallops RESPIRATORY:  Clear to auscultation without rales, wheezing or rhonchi  ABDOMEN: Soft, non-tender, non-distended MUSCULOSKELETAL:  No edema; No deformity  SKIN: Warm and dry LOWER EXTREMITIES: no swelling NEUROLOGIC:  Alert and oriented x 3 PSYCHIATRIC:  Normal  affect   ASSESSMENT:    1. Abnormal stress test ischemia involving anterior wall   2. Type 2 diabetes mellitus with complication, without long-term current use of insulin ([place])   3. Essential hypertension   4. Dyslipidemia    PLAN:    In order of problems listed above:  Coronary disease abnormal stress test showing ischemia in LAD territory but lady does not want to have anything aggressive done she is happy where she is.  I told her proper way to do will be to do cardiac catheterization.  She does not want to.  She is completely asymptomatic and happy where she is. Type 2 diabetes.  Hemoglobin A1c from September 20, 2021 5.3 we will continue present management. Dyslipidemia I did review her K PN which show me her fasting lipid profile with LDL of 9380 HDL 31 we will recheck her fasting lipid profile Angina pectoris denies having any.  I will ask her to add Imdur 31 and more about his silent ischemia that she may be suffering from with her diabetes and she simply did not realize that she have a problem   Medication Adjustments/Labs and Tests Ordered: Current medicines are reviewed at length with the patient today.  Concerns regarding medicines are outlined above.  No orders of the defined types were placed in this encounter.  Medication changes:  Meds ordered this encounter  Medications   isosorbide mononitrate (IMDUR) 30 MG 24 hr tablet    Sig: Take 1 tablet (30 mg total) by mouth daily.    Dispense:  90 tablet    Refill:  3    Signed, Park Liter, MD, [facility] 01/24/2022 2:45 PM    [facility]

## 2022-01-24 NOTE — Patient Instructions (Signed)
Medication Instructions:  Your physician has recommended you make the following change in your medication:   START: Imdur '30mg'$  1 daily    Lab Work: None Ordered If you have labs (blood work) drawn today and your tests are completely normal, you will receive your results only by: [place] (if you have MyChart) OR A paper copy in the mail If you have any lab test that is abnormal or we need to change your treatment, we will call you to review the results.   Testing/Procedures: None Ordered   Follow-Up: At [facility], you and your health needs are our priority.  As part of our continuing mission to provide you with exceptional heart care, we have created designated Provider Care Teams.  These Care Teams include your primary Cardiologist (physician) and Advanced Practice Providers (APPs -  Physician Assistants and Nurse Practitioners) who all work together to provide you with the care you need, when you need it.  We recommend signing up for the patient portal called "MyChart".  Sign up information is provided on this After Visit Summary.  MyChart is used to connect with patients for Virtual Visits (Telemedicine).  Patients are able to view lab/test results, encounter notes, upcoming appointments, etc.  Non-urgent messages can be sent to your provider as well.   To learn more about what you can do with MyChart, go to [email or web address].    Your next appointment:   3 month(s)  The format for your next appointment:   In Person  Provider:   Jenne Campus, MD    Other Instructions NA

## 2022-02-23 DIAGNOSIS — E612 Magnesium deficiency: Secondary | ICD-10-CM | POA: Diagnosis not present

## 2022-02-23 DIAGNOSIS — E7800 Pure hypercholesterolemia, unspecified: Secondary | ICD-10-CM | POA: Diagnosis not present

## 2022-02-23 DIAGNOSIS — E039 Hypothyroidism, unspecified: Secondary | ICD-10-CM | POA: Diagnosis not present

## 2022-02-23 DIAGNOSIS — E1169 Type 2 diabetes mellitus with other specified complication: Secondary | ICD-10-CM | POA: Diagnosis not present

## 2022-02-23 DIAGNOSIS — E038 Other specified hypothyroidism: Secondary | ICD-10-CM | POA: Diagnosis not present

## 2022-02-23 DIAGNOSIS — I10 Essential (primary) hypertension: Secondary | ICD-10-CM | POA: Diagnosis not present

## 2022-02-23 DIAGNOSIS — B0223 Postherpetic polyneuropathy: Secondary | ICD-10-CM | POA: Diagnosis not present

## 2022-02-23 DIAGNOSIS — I5032 Chronic diastolic (congestive) heart failure: Secondary | ICD-10-CM | POA: Diagnosis not present

## 2022-03-16 DIAGNOSIS — I10 Essential (primary) hypertension: Secondary | ICD-10-CM | POA: Diagnosis not present

## 2022-03-16 DIAGNOSIS — E7800 Pure hypercholesterolemia, unspecified: Secondary | ICD-10-CM | POA: Diagnosis not present

## 2022-04-14 DIAGNOSIS — D692 Other nonthrombocytopenic purpura: Secondary | ICD-10-CM | POA: Diagnosis not present

## 2022-04-14 DIAGNOSIS — I10 Essential (primary) hypertension: Secondary | ICD-10-CM | POA: Diagnosis not present

## 2022-04-29 ENCOUNTER — Encounter: Payer: Self-pay | Admitting: Cardiology

## 2022-04-29 ENCOUNTER — Ambulatory Visit: Payer: Medicare Other | Attending: Cardiology | Admitting: Cardiology

## 2022-04-29 VITALS — BP 120/80 | HR 56 | Resp 18 | Ht 64.0 in | Wt 142.4 lb

## 2022-04-29 DIAGNOSIS — R9439 Abnormal result of other cardiovascular function study: Secondary | ICD-10-CM | POA: Diagnosis not present

## 2022-04-29 DIAGNOSIS — I10 Essential (primary) hypertension: Secondary | ICD-10-CM | POA: Diagnosis not present

## 2022-04-29 DIAGNOSIS — E118 Type 2 diabetes mellitus with unspecified complications: Secondary | ICD-10-CM | POA: Diagnosis not present

## 2022-04-29 DIAGNOSIS — R0609 Other forms of dyspnea: Secondary | ICD-10-CM | POA: Diagnosis not present

## 2022-04-29 NOTE — Progress Notes (Signed)
Cardiology Office Note:    Date:  04/29/2022   ID:  Sherry Herrera, DOB [birth year], MRN [id number]  PCP:  Greig Right, MD  Cardiologist:  Jenne Campus, MD    Referring MD: Greig Right, MD   No chief complaint on file. Doing fine  History of Present Illness:    Sherry Herrera is a 67 y.o. female with past medical history significant for essential hypertension, dyslipidemia, TIA, history of syncope, history of shingles chronic shoulder pain.  She did have a stress test done which showed ischemia in LAD territory, we had multiple discussion about potentially doing cardiac catheterization, however she, does not want to do it.  She tells me today that she is trying to exercise and regular basis and she describes different situations that now she can do more for example she has a small dog that she was unable to walk with him now he walk with the dog with no difficulties.  Past Medical History:  Diagnosis Date   Calculus of gallbladder with chronic cholecystitis without obstruction 11/18/2020   Chronic renal failure    Essential (primary) hypertension    GERD (gastroesophageal reflux disease)    Hypothyroidism    Obesity    Osteoarthritis    Right Shoulder   Pure hypercholesterolemia    Transient ischemic attack 2006   left arm numbness; never confirmed. CT and MRI negative   Type 2 diabetes mellitus [facility])     Past Surgical History:  Procedure Laterality Date   CHOLECYSTECTOMY  11/2020   COLON RESECTION  08/2010   Partial colon resection-transvers colon for tubular adenoma   FOOT SURGERY Bilateral    Had small benign tumors removed   TUBAL LIGATION      Current Medications: Current Meds  Medication Sig   amLODipine (NORVASC) 2.5 MG tablet Take 2.5 mg by mouth daily.   aspirin EC 81 MG tablet Take 81 mg by mouth daily. Swallow whole.   atorvastatin (LIPITOR) 80 MG tablet Take 80 mg by mouth at bedtime.   dapagliflozin propanediol (FARXIGA) 10 MG TABS tablet Take  10 mg by mouth in the morning.   ezetimibe (ZETIA) 10 MG tablet Take 10 mg by mouth daily.   fenofibrate 160 MG tablet Take 160 mg by mouth daily.   furosemide (LASIX) 20 MG tablet Take 20 mg by mouth 2 (two) times daily.   isosorbide mononitrate (IMDUR) 30 MG 24 hr tablet Take 1 tablet (30 mg total) by mouth daily.   levothyroxine (SYNTHROID) 88 MCG tablet Take 88 mcg by mouth daily before breakfast.   magnesium oxide (MAG-OX) 400 (240 Mg) MG tablet Take 400 mg by mouth 2 (two) times daily.   meclizine (ANTIVERT) 25 MG tablet Take 25 mg by mouth 3 (three) times daily as needed for dizziness or nausea.   nebivolol (BYSTOLIC) 5 MG tablet Take 10 mg by mouth daily.   olmesartan-hydrochlorothiazide (BENICAR HCT) 40-12.5 MG tablet Take 1 tablet by mouth daily.   omeprazole (PRILOSEC) 20 MG capsule Take 20 mg by mouth daily.   potassium chloride (KLOR-CON M) 10 MEQ tablet Take 20 mEq by mouth daily.   ranolazine (RANEXA) 500 MG 12 hr tablet Take 1 tablet (500 mg total) by mouth 2 (two) times daily.   sertraline (ZOLOFT) 100 MG tablet Take 100 mg by mouth daily.     Allergies:   Lisinopril   Social History   Socioeconomic History   Marital status: Widowed    Spouse name: Not on file  Number of children: Not on file   Years of education: Not on file   Highest education level: Not on file  Occupational History   Not on file  Tobacco Use   Smoking status: Never   Smokeless tobacco: Never  Substance and Sexual Activity   Alcohol use: Never   Drug use: Not on file   Sexual activity: Not on file  Other Topics Concern   Not on file  Social History Narrative   Not on file   Social Determinants of Health   Financial Resource Strain: Not on file  Food Insecurity: Not on file  Transportation Needs: Not on file  Physical Activity: Not on file  Stress: Not on file  Social Connections: Not on file     Family History: The patient's family history includes Diabetes in her mother; Heart  attack in her father; Hyperlipidemia in her sister and sister; Hypertension in her brother, mother, sister, and sister; Kidney failure in her mother; Osteoarthritis in her mother; Stroke in her mother. ROS:   Please see the history of present illness.    All 14 point review of systems negative except as described per history of present illness  EKGs/Labs/Other Studies Reviewed:      Recent Labs: 06/20/2021: TSH 1.969 06/21/2021: ALT 33; Magnesium 2.3 06/23/2021: BUN 24; Creatinine, Ser 1.17; Hemoglobin 9.4; Platelets 286; Potassium 3.9; Sodium 137  Recent Lipid Panel    Component Value Date/Time   CHOL (H) 03/30/2007 0845    234        ATP III CLASSIFICATION:  <200     mg/dL   Desirable  200-239  mg/dL   Borderline High  >=240    mg/dL   High   TRIG 171 (H) 03/30/2007 0845   HDL 38 (L) 03/30/2007 0845   CHOLHDL 6.2 03/30/2007 0845   VLDL 34 03/30/2007 0845   LDLCALC (H) 03/30/2007 0845    162        Total Cholesterol/HDL:CHD Risk Coronary Heart Disease Risk Table                     Men   Women  1/2 Average Risk   3.4   3.3    Physical Exam:    VS:  BP 120/80 (BP Location: Right Arm, Patient Position: Sitting, Cuff Size: Normal)   Pulse (!) 56   Resp 18   Ht 5\' 4"  (1.626 m)   Wt 142 lb 6.4 oz (64.6 kg)   SpO2 99%   BMI 24.44 kg/m     Wt Readings from Last 3 Encounters:  04/29/22 142 lb 6.4 oz (64.6 kg)  01/24/22 131 lb 9.6 oz (59.7 kg)  10/25/21 135 lb 6.4 oz (61.4 kg)     GEN:  Well nourished, well developed in no acute distress HEENT: Normal NECK: No JVD; No carotid bruits LYMPHATICS: No lymphadenopathy CARDIAC: RRR, no murmurs, no rubs, no gallops RESPIRATORY:  Clear to auscultation without rales, wheezing or rhonchi  ABDOMEN: Soft, non-tender, non-distended MUSCULOSKELETAL:  No edema; No deformity  SKIN: Warm and dry LOWER EXTREMITIES: no swelling NEUROLOGIC:  Alert and oriented x 3 PSYCHIATRIC:  Normal affect   ASSESSMENT:    1. Dyspnea on exertion    2. Abnormal stress test ischemia involving anterior wall   3. Essential hypertension   4. Type 2 diabetes mellitus with complication, without long-term current use of insulin ([facility])    PLAN:    In order of problems listed above:  Coronary  artery disease anterior wall ischemia based on stress test.  Again I brought an issue of potentially doing cardiac catheterization, she does not want to I gave her another option and alternative in form of coronary CT angio she still does not want to do it prefer medical therapy. Essential hypertension blood pressure well-controlled continue present management. Dyslipidemia I did review her K PN which show me LDL 93 HDL 31 will make arrangements for another fasting lipid profile to be done she is taking Lipitor 80.  Day if she may benefit also from addition of next cholesterol since she is already also on Zetia the key will be to recheck her fasting lipid profile Type 2 diabetes that being followed by antimedicine team her last hemoglobin A1c 5.3   Medication Adjustments/Labs and Tests Ordered: Current medicines are reviewed at length with the patient today.  Concerns regarding medicines are outlined above.  No orders of the defined types were placed in this encounter.  Medication changes: No orders of the defined types were placed in this encounter.   Signed, Park Liter, MD, [facility] 04/29/2022 1:49 PM    [place]

## 2022-04-29 NOTE — Patient Instructions (Addendum)
Medication Instructions:  Your physician recommends that you continue on your current medications as directed. Please refer to the Current Medication list given to you today.  *If you need a refill on your cardiac medications before your next appointment, please call your pharmacy*   Lab Work: None ordered If you have labs (blood work) drawn today and your tests are completely normal, you will receive your results only by: MyChart Message (if you have MyChart) OR A paper copy in the mail If you have any lab test that is abnormal or we need to change your treatment, we will call you to review the results.   Testing/Procedures: None ordered   Follow-Up: At [facility], you and your health needs are our priority.  As part of our continuing mission to provide you with exceptional heart care, we have created designated Provider Care Teams.  These Care Teams include your primary Cardiologist (physician) and Advanced Practice Providers (APPs -  Physician Assistants and Nurse Practitioners) who all work together to provide you with the care you need, when you need it.  We recommend signing up for the patient portal called "MyChart".  Sign up information is provided on this After Visit Summary.  MyChart is used to connect with patients for Virtual Visits (Telemedicine).  Patients are able to view lab/test results, encounter notes, upcoming appointments, etc.  Non-urgent messages can be sent to your provider as well.   To learn more about what you can do with MyChart, go to [email or web address].    Your next appointment:   6 month(s)  The format for your next appointment:   In Person  Provider:   Robert Krasowski, MD    Other Instructions none  Important Information About Sugar       

## 2022-05-19 DIAGNOSIS — H524 Presbyopia: Secondary | ICD-10-CM | POA: Diagnosis not present

## 2022-05-19 DIAGNOSIS — E119 Type 2 diabetes mellitus without complications: Secondary | ICD-10-CM | POA: Diagnosis not present

## 2022-05-19 DIAGNOSIS — H52203 Unspecified astigmatism, bilateral: Secondary | ICD-10-CM | POA: Diagnosis not present

## 2022-05-19 DIAGNOSIS — Z961 Presence of intraocular lens: Secondary | ICD-10-CM | POA: Diagnosis not present

## 2022-05-25 DIAGNOSIS — E612 Magnesium deficiency: Secondary | ICD-10-CM | POA: Diagnosis not present

## 2022-05-25 DIAGNOSIS — E559 Vitamin D deficiency, unspecified: Secondary | ICD-10-CM | POA: Diagnosis not present

## 2022-05-25 DIAGNOSIS — E1169 Type 2 diabetes mellitus with other specified complication: Secondary | ICD-10-CM | POA: Diagnosis not present

## 2022-05-25 DIAGNOSIS — E7800 Pure hypercholesterolemia, unspecified: Secondary | ICD-10-CM | POA: Diagnosis not present

## 2022-05-25 DIAGNOSIS — I5032 Chronic diastolic (congestive) heart failure: Secondary | ICD-10-CM | POA: Diagnosis not present

## 2022-05-25 DIAGNOSIS — I10 Essential (primary) hypertension: Secondary | ICD-10-CM | POA: Diagnosis not present

## 2022-05-25 DIAGNOSIS — E039 Hypothyroidism, unspecified: Secondary | ICD-10-CM | POA: Diagnosis not present

## 2022-05-26 LAB — LAB REPORT - SCANNED: A1c: 5.6

## 2022-07-18 DIAGNOSIS — I5032 Chronic diastolic (congestive) heart failure: Secondary | ICD-10-CM | POA: Diagnosis not present

## 2022-07-18 DIAGNOSIS — N1832 Chronic kidney disease, stage 3b: Secondary | ICD-10-CM | POA: Diagnosis not present

## 2022-07-18 DIAGNOSIS — E1169 Type 2 diabetes mellitus with other specified complication: Secondary | ICD-10-CM | POA: Diagnosis not present

## 2022-07-18 DIAGNOSIS — I10 Essential (primary) hypertension: Secondary | ICD-10-CM | POA: Diagnosis not present

## 2022-07-19 DIAGNOSIS — M19012 Primary osteoarthritis, left shoulder: Secondary | ICD-10-CM | POA: Diagnosis not present

## 2022-07-26 DIAGNOSIS — N1832 Chronic kidney disease, stage 3b: Secondary | ICD-10-CM | POA: Diagnosis not present

## 2022-07-26 DIAGNOSIS — N189 Chronic kidney disease, unspecified: Secondary | ICD-10-CM | POA: Diagnosis not present

## 2022-10-03 ENCOUNTER — Other Ambulatory Visit: Payer: Self-pay | Admitting: Cardiology

## 2022-10-19 DIAGNOSIS — M19011 Primary osteoarthritis, right shoulder: Secondary | ICD-10-CM | POA: Diagnosis not present

## 2022-12-05 ENCOUNTER — Encounter: Payer: Self-pay | Admitting: Cardiology

## 2022-12-06 ENCOUNTER — Encounter: Payer: Self-pay | Admitting: Cardiology

## 2022-12-06 ENCOUNTER — Ambulatory Visit: Payer: Medicare Other | Attending: Cardiology | Admitting: Cardiology

## 2022-12-06 VITALS — BP 142/80 | HR 51 | Ht 64.0 in | Wt 152.6 lb

## 2022-12-06 DIAGNOSIS — R0609 Other forms of dyspnea: Secondary | ICD-10-CM

## 2022-12-06 DIAGNOSIS — I10 Essential (primary) hypertension: Secondary | ICD-10-CM | POA: Diagnosis not present

## 2022-12-06 DIAGNOSIS — R9439 Abnormal result of other cardiovascular function study: Secondary | ICD-10-CM | POA: Diagnosis not present

## 2022-12-06 DIAGNOSIS — E118 Type 2 diabetes mellitus with unspecified complications: Secondary | ICD-10-CM | POA: Diagnosis not present

## 2022-12-06 MED ORDER — OLMESARTAN MEDOXOMIL 20 MG PO TABS: 20.0000 mg | ORAL_TABLET | Freq: Every day | ORAL | 3 refills | Status: DC

## 2022-12-06 NOTE — Patient Instructions (Signed)
Medication Instructions:   STOP: Olmesartan/hydrochlorothiazide  START: Benicar 20mg  1 tablet daily   Lab Work: Your physician recommends that you return for lab work in: 10 days You need to have labs done when you are fasting.  You can come Monday through Friday 8:30 am to 12:00 pm and 1:15 to 4:30. You do not need to make an appointment as the order has already been placed. The labs you are going to have done are BMET   Testing/Procedures: None Ordered   Follow-Up: At [facility], you and your health needs are our priority.  As part of our continuing mission to provide you with exceptional heart care, we have created designated Provider Care Teams.  These Care Teams include your primary Cardiologist (physician) and Advanced Practice Providers (APPs -  Physician Assistants and Nurse Practitioners) who all work together to provide you with the care you need, when you need it.  We recommend signing up for the patient portal called "MyChart".  Sign up information is provided on this After Visit Summary.  MyChart is used to connect with patients for Virtual Visits (Telemedicine).  Patients are able to view lab/test results, encounter notes, upcoming appointments, etc.  Non-urgent messages can be sent to your provider as well.   To learn more about what you can do with MyChart, go to [email or web address].    Your next appointment:   1 month(s)  The format for your next appointment:   In Person  Provider:   Gypsy Balsam, MD    Other Instructions NA

## 2022-12-06 NOTE — Progress Notes (Signed)
Cardiology Office Note:    Date:  12/06/2022   ID:  Sherry Herrera, DOB 01/10/1956, MRN [id number]  PCP:  Alinda Deem, MD  Cardiologist:  Gypsy Balsam, MD    Referring MD: Alinda Deem, MD   Chief Complaint  Patient presents with   Clearance TBD    History of Present Illness:    Sherry Herrera is a 67 y.o. female with past medical history significant for abnormal stress test done in August 2023 showing ischemia in LAD territory, ejection fraction 45 to 50%, essential hypertension, dyslipidemia, history of TIA, chronic pain in the shoulder.  She came today to me to talk about potential right shoulder replacement surgery.  His difficulty with her story and symptoms.  She described to have some chest pain located in the middle of the chest sometimes happens when she walks and works.  She thinks may be related to her right shoulder pain.  However she did get injection to her shoulder with significant relief in pain in shoulder and pain was still going on.  Previously we talked about coronary CT angio versus cardiac catheterization however he kidney function deteriorated and now her creatinine is 2.0 which make [facility] catheterization more difficult.  Past Medical History:  Diagnosis Date   Calculus of gallbladder with chronic cholecystitis without obstruction 11/18/2020   Chronic renal failure    Essential (primary) hypertension    GERD (gastroesophageal reflux disease)    Hypothyroidism    Obesity    Osteoarthritis    Right Shoulder   Pure hypercholesterolemia    Transient ischemic attack 2006   left arm numbness; never confirmed. CT and MRI negative   Type 2 diabetes mellitus [facility])     Past Surgical History:  Procedure Laterality Date   CHOLECYSTECTOMY  11/2020   COLON RESECTION  08/2010   Partial colon resection-transvers colon for tubular adenoma   FOOT SURGERY Bilateral    Had small benign tumors removed   TUBAL LIGATION      Current Medications: Current Meds   Medication Sig   amLODipine (NORVASC) 2.5 MG tablet Take 2.5 mg by mouth daily.   aspirin EC 81 MG tablet Take 81 mg by mouth daily. Swallow whole.   atorvastatin (LIPITOR) 80 MG tablet Take 80 mg by mouth at bedtime.   dapagliflozin propanediol (FARXIGA) 10 MG TABS tablet Take 10 mg by mouth in the morning.   ezetimibe (ZETIA) 10 MG tablet Take 10 mg by mouth daily.   fenofibrate 160 MG tablet Take 160 mg by mouth daily.   furosemide (LASIX) 20 MG tablet Take 20 mg by mouth 2 (two) times daily.   isosorbide mononitrate (IMDUR) 30 MG 24 hr tablet Take 1 tablet (30 mg total) by mouth daily.   levothyroxine (SYNTHROID) 88 MCG tablet Take 88 mcg by mouth daily before breakfast.   magnesium oxide (MAG-OX) 400 (240 Mg) MG tablet Take 400 mg by mouth 2 (two) times daily.   nebivolol (BYSTOLIC) 5 MG tablet Take 10 mg by mouth daily.   olmesartan-hydrochlorothiazide (BENICAR HCT) 40-12.5 MG tablet Take 1 tablet by mouth daily.   omeprazole (PRILOSEC) 20 MG capsule Take 20 mg by mouth daily as needed (Indigestion).   potassium chloride (KLOR-CON M) 10 MEQ tablet Take 20 mEq by mouth daily.   ranolazine (RANEXA) 500 MG 12 hr tablet TAKE 1 TABLET BY MOUTH 2 TIMES DAILY.   sertraline (ZOLOFT) 100 MG tablet Take 100 mg by mouth daily.   [DISCONTINUED] meclizine (ANTIVERT) 25 MG tablet Take  25 mg by mouth 3 (three) times daily as needed for dizziness or nausea.     Allergies:   Lisinopril   Social History   Socioeconomic History   Marital status: Widowed    Spouse name: Not on file   Number of children: Not on file   Years of education: Not on file   Highest education level: Not on file  Occupational History   Not on file  Tobacco Use   Smoking status: Never   Smokeless tobacco: Never  Substance and Sexual Activity   Alcohol use: Never   Drug use: Not on file   Sexual activity: Not on file  Other Topics Concern   Not on file  Social History Narrative   Not on file   Social  Determinants of Health   Financial Resource Strain: Not on file  Food Insecurity: Not on file  Transportation Needs: Not on file  Physical Activity: Not on file  Stress: Not on file  Social Connections: Not on file     Family History: The patient's family history includes Diabetes in her mother; Heart attack in her father; Hyperlipidemia in her sister and sister; Hypertension in her brother, mother, sister, and sister; Kidney failure in her mother; Osteoarthritis in her mother; Stroke in her mother. ROS:   Please see the history of present illness.    All 14 point review of systems negative except as described per history of present illness  EKGs/Labs/Other Studies Reviewed:         Recent Labs: No results found for requested labs within last 365 days.  Recent Lipid Panel    Component Value Date/Time   CHOL (H) 03/30/2007 0845    234        ATP III CLASSIFICATION:  <200     mg/dL   Desirable  332-951  mg/dL   Borderline High  >=884    mg/dL   High   TRIG 166 (H) 08/14/1599 0845   HDL 38 (L) 03/30/2007 0845   CHOLHDL 6.2 03/30/2007 0845   VLDL 34 03/30/2007 0845   LDLCALC (H) 03/30/2007 0845    162        Total Cholesterol/HDL:CHD Risk Coronary Heart Disease Risk Table                     Men   Women  1/2 Average Risk   3.4   3.3    Physical Exam:    VS:  BP (!) 142/80 (BP Location: Left Arm, Patient Position: Sitting)   Pulse (!) 51   Ht 5\' 4"  (1.626 m)   Wt 152 lb 9.6 oz (69.2 kg)   SpO2 93%   BMI 26.19 kg/m     Wt Readings from Last 3 Encounters:  12/06/22 152 lb 9.6 oz (69.2 kg)  04/29/22 142 lb 6.4 oz (64.6 kg)  01/24/22 131 lb 9.6 oz (59.7 kg)     GEN:  Well nourished, well developed in no acute distress HEENT: Normal NECK: No JVD; No carotid bruits LYMPHATICS: No lymphadenopathy CARDIAC: RRR, no murmurs, no rubs, no gallops RESPIRATORY:  Clear to auscultation without rales, wheezing or rhonchi  ABDOMEN: Soft, non-tender,  non-distended MUSCULOSKELETAL:  No edema; No deformity  SKIN: Warm and dry LOWER EXTREMITIES: no swelling NEUROLOGIC:  Alert and oriented x 3 PSYCHIATRIC:  Normal affect   ASSESSMENT:    1. Dyspnea on exertion   2. Abnormal stress test ischemia involving anterior wall   3. Essential hypertension  4. Type 2 diabetes mellitus with complication, without long-term current use of insulin (HCC)    PLAN:    In order of problems listed above:  Abnormal stress test.  We had a long discussion about the situation if we talk about potentially doing surgery I think cardiac catheterization will be needed I think the load of dye will be less and cardiac catheterization coronary CT angio.  Obviously kidney dysfunction is an obstacle right now.  I will discontinue her hydrochlorothiazide, I will reduce dose of Benicar and then repeat her kidney function test to see if there is any improvement by this maneuver if that is the case we will make arrangements for cardiac catheterization. Cardiovascular evaluation before incoming right shoulder replacement surgery plan as discussed above.  I think we need to do cardiac catheterization to clarify nature of her problem to make sure she does not have significant critical LAD disease. Essential hypertension blood pressure is well-controlled continue present management. Type 2 diabetes followed by internal medicine team hemoglobin A1c from April of this year is 5.6. Dyslipidemia I did review K PN however data fasting from April with total cholesterol 171 HDL of 80.  Will try to schedule her to have fasting lipid profile.  She is on 80 of Lipitor   Medication Adjustments/Labs and Tests Ordered: Current medicines are reviewed at length with the patient today.  Concerns regarding medicines are outlined above.  Orders Placed This Encounter  Procedures   EKG 12-Lead   Medication changes: No orders of the defined types were placed in this  encounter.   Signed, Georgeanna Lea, MD, [facility] 12/06/2022 10:39 AM    [facility]

## 2022-12-06 NOTE — Addendum Note (Signed)
Addended by: Baldo Ash D on: 12/06/2022 11:01 AM   Modules accepted: Orders

## 2022-12-15 DIAGNOSIS — I10 Essential (primary) hypertension: Secondary | ICD-10-CM | POA: Diagnosis not present

## 2022-12-15 DIAGNOSIS — N184 Chronic kidney disease, stage 4 (severe): Secondary | ICD-10-CM | POA: Diagnosis not present

## 2022-12-15 DIAGNOSIS — Z0000 Encounter for general adult medical examination without abnormal findings: Secondary | ICD-10-CM | POA: Diagnosis not present

## 2022-12-15 DIAGNOSIS — E038 Other specified hypothyroidism: Secondary | ICD-10-CM | POA: Diagnosis not present

## 2022-12-15 DIAGNOSIS — I700 Atherosclerosis of aorta: Secondary | ICD-10-CM | POA: Diagnosis not present

## 2022-12-15 DIAGNOSIS — E7800 Pure hypercholesterolemia, unspecified: Secondary | ICD-10-CM | POA: Diagnosis not present

## 2022-12-15 DIAGNOSIS — E1169 Type 2 diabetes mellitus with other specified complication: Secondary | ICD-10-CM | POA: Diagnosis not present

## 2022-12-15 DIAGNOSIS — Z1389 Encounter for screening for other disorder: Secondary | ICD-10-CM | POA: Diagnosis not present

## 2022-12-15 DIAGNOSIS — Z2821 Immunization not carried out because of patient refusal: Secondary | ICD-10-CM | POA: Diagnosis not present

## 2022-12-17 LAB — LAB REPORT - SCANNED
A1c: 6
EGFR: 28
Free T4: 1.4 ng/dL
TSH: 0.48 (ref 0.41–5.90)

## 2022-12-21 DIAGNOSIS — I739 Peripheral vascular disease, unspecified: Secondary | ICD-10-CM | POA: Diagnosis not present

## 2022-12-25 DIAGNOSIS — I739 Peripheral vascular disease, unspecified: Secondary | ICD-10-CM | POA: Diagnosis not present

## 2023-01-10 ENCOUNTER — Encounter: Payer: Self-pay | Admitting: Cardiology

## 2023-01-10 ENCOUNTER — Ambulatory Visit: Payer: Medicare Other | Attending: Cardiology | Admitting: Cardiology

## 2023-01-10 VITALS — BP 130/58 | HR 50 | Ht 64.0 in | Wt 154.0 lb

## 2023-01-10 DIAGNOSIS — I10 Essential (primary) hypertension: Secondary | ICD-10-CM

## 2023-01-10 DIAGNOSIS — R9439 Abnormal result of other cardiovascular function study: Secondary | ICD-10-CM | POA: Diagnosis not present

## 2023-01-10 DIAGNOSIS — E785 Hyperlipidemia, unspecified: Secondary | ICD-10-CM | POA: Diagnosis not present

## 2023-01-10 DIAGNOSIS — E118 Type 2 diabetes mellitus with unspecified complications: Secondary | ICD-10-CM | POA: Diagnosis not present

## 2023-01-10 NOTE — Patient Instructions (Signed)
Medication Instructions:  Your physician recommends that you continue on your current medications as directed. Please refer to the Current Medication list given to you today.  *If you need a refill on your cardiac medications before your next appointment, please call your pharmacy*   Lab Work: BMP today If you have labs (blood work) drawn today and your tests are completely normal, you will receive your results only by: MyChart Message (if you have MyChart) OR A paper copy in the mail If you have any lab test that is abnormal or we need to change your treatment, we will call you to review the results.   Testing/Procedures: None Ordered   Follow-Up: At [facility], you and your health needs are our priority.  As part of our continuing mission to provide you with exceptional heart care, we have created designated Provider Care Teams.  These Care Teams include your primary Cardiologist (physician) and Advanced Practice Providers (APPs -  Physician Assistants and Nurse Practitioners) who all work together to provide you with the care you need, when you need it.  We recommend signing up for the patient portal called "MyChart".  Sign up information is provided on this After Visit Summary.  MyChart is used to connect with patients for Virtual Visits (Telemedicine).  Patients are able to view lab/test results, encounter notes, upcoming appointments, etc.  Non-urgent messages can be sent to your provider as well.   To learn more about what you can do with MyChart, go to [email or web address].    Your next appointment:   3 month(s)  The format for your next appointment:   In Person  Provider:   Robert Krasowski, MD    Other Instructions NA  

## 2023-01-10 NOTE — Progress Notes (Unsigned)
Cardiology Office Note:    Date:  01/10/2023   ID:  Sherry Herrera, DOB August 28, 1955, MRN [id number]  PCP:  Alinda Deem, MD  Cardiologist:  Gypsy Balsam, MD    Referring MD: Alinda Deem, MD   Chief Complaint  Patient presents with   Follow-up    History of Present Illness:    Sherry Herrera is a 67 y.o. female history significant for abnormal stress test showing LAD ischemia that was done in August 2023, also diminished ejection fraction 45 to 50%, essential hypertension, dyslipidemia, TIA, chronic pain to the shoulder she was referred to [place] for evaluation before shoulder surgery however because of abnormality of the stress test we start debating about how we can evaluate her coronaries either with [facility] catheterization or coronary CT angio.  But the problem and biggest obstacle is her creatinine being elevated.  We discontinue ARB as well as diuretic and I will recheck her Chem-7 if Chem-7 is reasonable then we will proceed with cardiac catheterization.  It is very difficult to get story from her.  She got a lot of pain in her shoulder because of chronic shoulder problem also described to have some pain in the chest happen in different situations  Past Medical History:  Diagnosis Date   Calculus of gallbladder with chronic cholecystitis without obstruction 11/18/2020   Chronic renal failure    Essential (primary) hypertension    GERD (gastroesophageal reflux disease)    Hypothyroidism    Obesity    Osteoarthritis    Right Shoulder   Pure hypercholesterolemia    Transient ischemic attack 2006   left arm numbness; never confirmed. CT and MRI negative   Type 2 diabetes mellitus [facility])     Past Surgical History:  Procedure Laterality Date   CHOLECYSTECTOMY  11/2020   COLON RESECTION  08/2010   Partial colon resection-transvers colon for tubular adenoma   FOOT SURGERY Bilateral    Had small benign tumors removed   TUBAL LIGATION      Current Medications: Current  Meds  Medication Sig   amLODipine (NORVASC) 2.5 MG tablet Take 2.5 mg by mouth daily.   aspirin EC 81 MG tablet Take 81 mg by mouth daily. Swallow whole.   atorvastatin (LIPITOR) 80 MG tablet Take 80 mg by mouth at bedtime.   dapagliflozin propanediol (FARXIGA) 10 MG TABS tablet Take 10 mg by mouth in the morning.   ezetimibe (ZETIA) 10 MG tablet Take 10 mg by mouth daily.   fenofibrate 160 MG tablet Take 160 mg by mouth daily.   furosemide (LASIX) 20 MG tablet Take 20 mg by mouth 2 (two) times daily.   isosorbide mononitrate (IMDUR) 30 MG 24 hr tablet Take 1 tablet (30 mg total) by mouth daily.   levothyroxine (SYNTHROID) 88 MCG tablet Take 88 mcg by mouth daily before breakfast.   magnesium oxide (MAG-OX) 400 (240 Mg) MG tablet Take 400 mg by mouth 2 (two) times daily.   nebivolol (BYSTOLIC) 5 MG tablet Take 10 mg by mouth daily.   olmesartan (BENICAR) 20 MG tablet Take 1 tablet (20 mg total) by mouth daily.   omeprazole (PRILOSEC) 20 MG capsule Take 20 mg by mouth daily as needed (Indigestion).   potassium chloride (KLOR-CON M) 10 MEQ tablet Take 20 mEq by mouth daily.   ranolazine (RANEXA) 500 MG 12 hr tablet TAKE 1 TABLET BY MOUTH 2 TIMES DAILY.   sertraline (ZOLOFT) 100 MG tablet Take 100 mg by mouth daily.     Allergies:  Lisinopril   Social History   Socioeconomic History   Marital status: Widowed    Spouse name: Not on file   Number of children: Not on file   Years of education: Not on file   Highest education level: Not on file  Occupational History   Not on file  Tobacco Use   Smoking status: Never   Smokeless tobacco: Never  Substance and Sexual Activity   Alcohol use: Never   Drug use: Not on file   Sexual activity: Not on file  Other Topics Concern   Not on file  Social History Narrative   Not on file   Social Determinants of Health   Financial Resource Strain: Not on file  Food Insecurity: Not on file  Transportation Needs: Not on file  Physical  Activity: Not on file  Stress: Not on file  Social Connections: Not on file     Family History: The patient's family history includes Diabetes in her mother; Heart attack in her father; Hyperlipidemia in her sister and sister; Hypertension in her brother, mother, sister, and sister; Kidney failure in her mother; Osteoarthritis in her mother; Stroke in her mother. ROS:   Please see the history of present illness.    All 14 point review of systems negative except as described per history of present illness  EKGs/Labs/Other Studies Reviewed:         Recent Labs: No results found for requested labs within last 365 days.  Recent Lipid Panel    Component Value Date/Time   CHOL (H) 03/30/2007 0845    234        ATP III CLASSIFICATION:  <200     mg/dL   Desirable  130-865  mg/dL   Borderline High  >=784    mg/dL   High   TRIG 696 (H) 29/52/8413 0845   HDL 38 (L) 03/30/2007 0845   CHOLHDL 6.2 03/30/2007 0845   VLDL 34 03/30/2007 0845   LDLCALC (H) 03/30/2007 0845    162        Total Cholesterol/HDL:CHD Risk Coronary Heart Disease Risk Table                     Men   Women  1/2 Average Risk   3.4   3.3    Physical Exam:    VS:  BP (!) 160/60 (BP Location: Left Arm, Patient Position: Sitting)   Pulse (!) 50   Ht 5\' 4"  (1.626 m)   Wt 154 lb (69.9 kg)   SpO2 98%   BMI 26.43 kg/m     Wt Readings from Last 3 Encounters:  01/10/23 154 lb (69.9 kg)  12/06/22 152 lb 9.6 oz (69.2 kg)  04/29/22 142 lb 6.4 oz (64.6 kg)     GEN:  Well nourished, well developed in no acute distress HEENT: Normal NECK: No JVD; No carotid bruits LYMPHATICS: No lymphadenopathy CARDIAC: RRR, no murmurs, no rubs, no gallops RESPIRATORY:  Clear to auscultation without rales, wheezing or rhonchi  ABDOMEN: Soft, non-tender, non-distended MUSCULOSKELETAL:  No edema; No deformity  SKIN: Warm and dry LOWER EXTREMITIES: no swelling NEUROLOGIC:  Alert and oriented x 3 PSYCHIATRIC:  Normal affect    ASSESSMENT:    1. Abnormal stress test ischemia involving anterior wall   2. Dyslipidemia   3. Type 2 diabetes mellitus with complication, without long-term current use of insulin (HCC)   4. Essential hypertension    PLAN:    In order of problems listed  above:  Abnormal stress test plan will be to repeat Chem-7 if Chem-7 is fine we will proceed with cardiac catheterization. Dyslipidemia I did review K PN which show me total cholesterol 166 HDL 66 this is from October 31.  She is on Lipitor 80 which is high intense statin and Zetia which I continue. Diabetes last hemoglobin A1c 6.0 reasonable continue present management. Essential hypertension slightly elevated but when we called some of her medication for potential cardiac catheterization   Medication Adjustments/Labs and Tests Ordered: Current medicines are reviewed at length with the patient today.  Concerns regarding medicines are outlined above.  No orders of the defined types were placed in this encounter.  Medication changes: No orders of the defined types were placed in this encounter.   Signed, Georgeanna Lea, MD, [facility] 01/10/2023 1:06 PM     [facility]

## 2023-01-11 LAB — BASIC METABOLIC PANEL
BUN/Creatinine Ratio: 27 (ref 12–28)
BUN: 47 mg/dL — ABNORMAL HIGH (ref 8–27)
CO2: 20 mmol/L (ref 20–29)
Calcium: 9.9 mg/dL (ref 8.7–10.3)
Chloride: 107 mmol/L — ABNORMAL HIGH (ref 96–106)
Creatinine, Ser: 1.77 mg/dL — ABNORMAL HIGH (ref 0.57–1.00)
Glucose: 163 mg/dL — ABNORMAL HIGH (ref 70–99)
Potassium: 5.2 mmol/L (ref 3.5–5.2)
Sodium: 140 mmol/L (ref 134–144)
eGFR: 31 mL/min/{1.73_m2} — ABNORMAL LOW (ref >59)

## 2023-01-16 ENCOUNTER — Other Ambulatory Visit: Payer: Self-pay | Admitting: Cardiology

## 2023-01-16 NOTE — Telephone Encounter (Signed)
Rx refill sent to pharmacy. 

## 2023-01-27 ENCOUNTER — Telehealth: Payer: Self-pay

## 2023-01-27 DIAGNOSIS — I10 Essential (primary) hypertension: Secondary | ICD-10-CM

## 2023-01-27 NOTE — Telephone Encounter (Signed)
-----   Message from Gypsy Balsam sent at 01/18/2023  8:28 AM EST ----- So we did kidney function rechecked last week which is slightly worse than before.  Therefore cardiac catheterization not a good idea.  Lets recheck Chem-7 this week

## 2023-01-27 NOTE — Telephone Encounter (Signed)
Patient notified of results and recommendations and agreed with plan, lab order on file.   

## 2023-02-02 ENCOUNTER — Other Ambulatory Visit: Payer: Self-pay | Admitting: Nephrology

## 2023-02-02 DIAGNOSIS — N1832 Chronic kidney disease, stage 3b: Secondary | ICD-10-CM | POA: Diagnosis not present

## 2023-02-02 DIAGNOSIS — E1122 Type 2 diabetes mellitus with diabetic chronic kidney disease: Secondary | ICD-10-CM | POA: Diagnosis not present

## 2023-02-02 DIAGNOSIS — I5030 Unspecified diastolic (congestive) heart failure: Secondary | ICD-10-CM | POA: Diagnosis not present

## 2023-02-02 DIAGNOSIS — E1129 Type 2 diabetes mellitus with other diabetic kidney complication: Secondary | ICD-10-CM | POA: Diagnosis not present

## 2023-02-02 DIAGNOSIS — I129 Hypertensive chronic kidney disease with stage 1 through stage 4 chronic kidney disease, or unspecified chronic kidney disease: Secondary | ICD-10-CM | POA: Diagnosis not present

## 2023-02-02 DIAGNOSIS — E785 Hyperlipidemia, unspecified: Secondary | ICD-10-CM | POA: Diagnosis not present

## 2023-02-02 DIAGNOSIS — G459 Transient cerebral ischemic attack, unspecified: Secondary | ICD-10-CM | POA: Diagnosis not present

## 2023-03-14 ENCOUNTER — Ambulatory Visit
Admission: RE | Admit: 2023-03-14 | Discharge: 2023-03-14 | Disposition: A | Discharge status: Home / Self Care | Payer: Medicare Other | Source: Ambulatory Visit | Attending: Nephrology | Admitting: Nephrology

## 2023-03-14 DIAGNOSIS — N1832 Chronic kidney disease, stage 3b: Secondary | ICD-10-CM

## 2023-04-07 ENCOUNTER — Ambulatory Visit: Payer: Medicare Other | Attending: Cardiology | Admitting: Cardiology

## 2023-04-07 ENCOUNTER — Encounter: Payer: Self-pay | Admitting: Cardiology

## 2023-04-07 VITALS — BP 134/60 | HR 55 | Ht 64.0 in | Wt 161.6 lb

## 2023-04-07 DIAGNOSIS — I10 Essential (primary) hypertension: Secondary | ICD-10-CM | POA: Diagnosis not present

## 2023-04-07 DIAGNOSIS — E785 Hyperlipidemia, unspecified: Secondary | ICD-10-CM | POA: Diagnosis not present

## 2023-04-07 DIAGNOSIS — E118 Type 2 diabetes mellitus with unspecified complications: Secondary | ICD-10-CM

## 2023-04-07 DIAGNOSIS — R9439 Abnormal result of other cardiovascular function study: Secondary | ICD-10-CM | POA: Diagnosis not present

## 2023-04-07 MED ORDER — AMLODIPINE BESYLATE 5 MG PO TABS: 5.0000 mg | ORAL_TABLET | Freq: Every day | ORAL | 3 refills | Status: DC

## 2023-04-07 NOTE — Progress Notes (Signed)
Cardiology Office Note:    Date:  04/07/2023   ID:  Sherry Herrera, DOB 21-Apr-1955, MRN [id number]  PCP:  Alinda Deem, MD  Cardiologist:  Gypsy Balsam, MD    Referring MD: Alinda Deem, MD   Chief Complaint  Patient presents with   Abdominal Pain    History of Present Illness:    Sherry Herrera is a 68 y.o. female past medical history significant for stress test which was done in August 2023 showing LAD ischemia also diminished ejection fraction 45-50, essential hypertension, dyslipidemia, TIA, chronic shoulder pain.  She was referred to [place] because of before shoulder surgery stress test was ordered abnormal and we debated about doing cardiac catheterization versus coronary CT angio.  The biggest obstacle is the fact that her kidney function is significantly diminished.  We stopped her ARB will stop diuretic Chem-7 supposed to be repeated however never happened because of bad weather she comes today for follow-up cardiac wise seems to be doing fine complain of having easily shortness of breath also complained of having abdominal pain that happen in different situations.  Past Medical History:  Diagnosis Date   Calculus of gallbladder with chronic cholecystitis without obstruction 11/18/2020   Chronic renal failure    Essential (primary) hypertension    GERD (gastroesophageal reflux disease)    Hypothyroidism    Obesity    Osteoarthritis    Right Shoulder   Pure hypercholesterolemia    Transient ischemic attack 2006   left arm numbness; never confirmed. CT and MRI negative   Type 2 diabetes mellitus [facility])     Past Surgical History:  Procedure Laterality Date   CHOLECYSTECTOMY  11/2020   COLON RESECTION  08/2010   Partial colon resection-transvers colon for tubular adenoma   FOOT SURGERY Bilateral    Had small benign tumors removed   TUBAL LIGATION      Current Medications: Current Meds  Medication Sig   acetaminophen (TYLENOL) 500 MG tablet Take 500 mg by mouth  every 6 (six) hours as needed for mild pain (pain score 1-3) or moderate pain (pain score 4-6).   amLODipine (NORVASC) 2.5 MG tablet Take 2.5 mg by mouth daily.   aspirin EC 81 MG tablet Take 81 mg by mouth daily. Swallow whole.   atorvastatin (LIPITOR) 80 MG tablet Take 80 mg by mouth at bedtime.   dapagliflozin propanediol (FARXIGA) 10 MG TABS tablet Take 10 mg by mouth in the morning.   ezetimibe (ZETIA) 10 MG tablet Take 10 mg by mouth daily.   fenofibrate 160 MG tablet Take 160 mg by mouth daily.   furosemide (LASIX) 20 MG tablet Take 20 mg by mouth 2 (two) times daily.   levothyroxine (SYNTHROID) 88 MCG tablet Take 88 mcg by mouth daily before breakfast.   magnesium oxide (MAG-OX) 400 (240 Mg) MG tablet Take 400 mg by mouth 2 (two) times daily.   nebivolol (BYSTOLIC) 5 MG tablet Take 10 mg by mouth daily.   olmesartan (BENICAR) 20 MG tablet Take 1 tablet (20 mg total) by mouth daily.   potassium chloride (KLOR-CON M) 10 MEQ tablet Take 20 mEq by mouth daily.   ranolazine (RANEXA) 500 MG 12 hr tablet TAKE 1 TABLET BY MOUTH 2 TIMES DAILY.   sertraline (ZOLOFT) 100 MG tablet Take 100 mg by mouth daily.   [DISCONTINUED] isosorbide mononitrate (IMDUR) 30 MG 24 hr tablet TAKE 1 TABLET (30 MG TOTAL) BY MOUTH DAILY.   [DISCONTINUED] omeprazole (PRILOSEC) 20 MG capsule Take 20 mg by  mouth daily as needed (Indigestion).     Allergies:   Lisinopril   Social History   Socioeconomic History   Marital status: Widowed    Spouse name: Not on file   Number of children: Not on file   Years of education: Not on file   Highest education level: Not on file  Occupational History   Not on file  Tobacco Use   Smoking status: Never   Smokeless tobacco: Never  Substance and Sexual Activity   Alcohol use: Never   Drug use: Not on file   Sexual activity: Not on file  Other Topics Concern   Not on file  Social History Narrative   Not on file   Social Drivers of Health   Financial Resource  Strain: Not on file  Food Insecurity: Not on file  Transportation Needs: Not on file  Physical Activity: Not on file  Stress: Not on file  Social Connections: Not on file     Family History: The patient's family history includes Diabetes in her mother; Heart attack in her father; Hyperlipidemia in her sister and sister; Hypertension in her brother, mother, sister, and sister; Kidney failure in her mother; Osteoarthritis in her mother; Stroke in her mother. ROS:   Please see the history of present illness.    All 14 point review of systems negative except as described per history of present illness  EKGs/Labs/Other Studies Reviewed:         Recent Labs: 12/17/2022: TSH 0.48 01/10/2023: BUN 47; Creatinine, Ser 1.77; Potassium 5.2; Sodium 140  Recent Lipid Panel    Component Value Date/Time   CHOL (H) 03/30/2007 0845    234        ATP III CLASSIFICATION:  <200     mg/dL   Desirable  960-454  mg/dL   Borderline High  >=098    mg/dL   High   TRIG 119 (H) 14/78/2956 0845   HDL 38 (L) 03/30/2007 0845   CHOLHDL 6.2 03/30/2007 0845   VLDL 34 03/30/2007 0845   LDLCALC (H) 03/30/2007 0845    162        Total Cholesterol/HDL:CHD Risk Coronary Heart Disease Risk Table                     Men   Women  1/2 Average Risk   3.4   3.3    Physical Exam:    VS:  BP 134/60 (BP Location: Left Arm, Patient Position: Sitting)   Pulse (!) 55   Ht 5\' 4"  (1.626 m)   Wt 161 lb 9.6 oz (73.3 kg)   SpO2 97%   BMI 27.74 kg/m     Wt Readings from Last 3 Encounters:  04/07/23 161 lb 9.6 oz (73.3 kg)  01/10/23 154 lb (69.9 kg)  12/06/22 152 lb 9.6 oz (69.2 kg)     GEN:  Well nourished, well developed in no acute distress HEENT: Normal NECK: No JVD; No carotid bruits LYMPHATICS: No lymphadenopathy CARDIAC: RRR, no murmurs, no rubs, no gallops RESPIRATORY:  Clear to auscultation without rales, wheezing or rhonchi  ABDOMEN: Soft, non-tender, non-distended MUSCULOSKELETAL:  No edema; No  deformity  SKIN: Warm and dry LOWER EXTREMITIES: no swelling NEUROLOGIC:  Alert and oriented x 3 PSYCHIATRIC:  Normal affect   ASSESSMENT:    1. Essential hypertension   2. Type 2 diabetes mellitus with complication, without long-term current use of insulin (HCC)   3. Abnormal stress test ischemia involving anterior wall  4. Dyslipidemia    PLAN:    In order of problems listed above:  Abnormal stress test still debating about potentially doing cardiac catheterization medication with change she does see a nephrologist I will check her Chem-7 today.  If is normal we will consider cardiac catheterization Type 2 diabetes I did review K PN which show hemoglobin A1c of 6.0 continue present management. Essential hypertension blood pressure well-controlled. Dyslipidemia I did review K PN which show me data from October 2024 total cholesterol 166 HDL 66.  She is on Zetia and Lipitor I will continue. She complained of having abdominal pain I will refer her to our GI doctors.   Medication Adjustments/Labs and Tests Ordered: Current medicines are reviewed at length with the patient today.  Concerns regarding medicines are outlined above.  No orders of the defined types were placed in this encounter.  Medication changes: No orders of the defined types were placed in this encounter.   Signed, Georgeanna Lea, MD, [facility] 04/07/2023 2:04 PM    [facility]

## 2023-04-07 NOTE — Patient Instructions (Addendum)
Medication Instructions:   INCREASE: Amlodipine to 5mg  daily   Lab Work: [employer]- today If you have labs (blood work) drawn today and your tests are completely normal, you will receive your results only by: MyChart Message (if you have MyChart) OR A paper copy in the mail If you have any lab test that is abnormal or we need to change your treatment, we will call you to review the results.   Testing/Procedures: None Ordered   Follow-Up: At [facility], you and your health needs are our priority.  As part of our continuing mission to provide you with exceptional heart care, we have created designated Provider Care Teams.  These Care Teams include your primary Cardiologist (physician) and Advanced Practice Providers (APPs -  Physician Assistants and Nurse Practitioners) who all work together to provide you with the care you need, when you need it.  We recommend signing up for the patient portal called "MyChart".  Sign up information is provided on this After Visit Summary.  MyChart is used to connect with patients for Virtual Visits (Telemedicine).  Patients are able to view lab/test results, encounter notes, upcoming appointments, etc.  Non-urgent messages can be sent to your provider as well.   To learn more about what you can do with MyChart, go to [email or web address].    Your next appointment:   3 month(s)  The format for your next appointment:   In Person  Provider:   Gypsy Balsam, MD    Other Instructions NA

## 2023-04-07 NOTE — Addendum Note (Signed)
Addended by: Baldo Ash D on: 04/07/2023 02:13 PM   Modules accepted: Orders

## 2023-04-08 LAB — BASIC METABOLIC PANEL
BUN/Creatinine Ratio: 28 (ref 12–28)
BUN: 55 mg/dL — ABNORMAL HIGH (ref 8–27)
CO2: 18 mmol/L — ABNORMAL LOW (ref 20–29)
Calcium: 10 mg/dL (ref 8.7–10.3)
Chloride: 108 mmol/L — ABNORMAL HIGH (ref 96–106)
Creatinine, Ser: 1.99 mg/dL — ABNORMAL HIGH (ref 0.57–1.00)
Glucose: 92 mg/dL (ref 70–99)
Potassium: 5.3 mmol/L — ABNORMAL HIGH (ref 3.5–5.2)
Sodium: 140 mmol/L (ref 134–144)
eGFR: 27 mL/min/{1.73_m2} — ABNORMAL LOW (ref >59)

## 2023-05-04 DIAGNOSIS — E039 Hypothyroidism, unspecified: Secondary | ICD-10-CM | POA: Diagnosis not present

## 2023-05-04 DIAGNOSIS — I5032 Chronic diastolic (congestive) heart failure: Secondary | ICD-10-CM | POA: Diagnosis not present

## 2023-05-04 DIAGNOSIS — I10 Essential (primary) hypertension: Secondary | ICD-10-CM | POA: Diagnosis not present

## 2023-05-04 DIAGNOSIS — K219 Gastro-esophageal reflux disease without esophagitis: Secondary | ICD-10-CM | POA: Diagnosis not present

## 2023-05-04 DIAGNOSIS — E1169 Type 2 diabetes mellitus with other specified complication: Secondary | ICD-10-CM | POA: Diagnosis not present

## 2023-05-04 DIAGNOSIS — N1832 Chronic kidney disease, stage 3b: Secondary | ICD-10-CM | POA: Diagnosis not present

## 2023-05-05 ENCOUNTER — Telehealth: Payer: Self-pay

## 2023-05-05 DIAGNOSIS — I10 Essential (primary) hypertension: Secondary | ICD-10-CM

## 2023-05-05 NOTE — Telephone Encounter (Signed)
Patient notified of results and recommendations and agreed with plan, lab order on file.   

## 2023-05-05 NOTE — Telephone Encounter (Signed)
-----   Message from Gypsy Balsam sent at 05/04/2023  9:55 AM EDT ----- Chem-7 need to be repeated, last numbers show elevated elevated potassium elevated elevated creatinine

## 2023-05-12 DIAGNOSIS — I10 Essential (primary) hypertension: Secondary | ICD-10-CM | POA: Diagnosis not present

## 2023-05-13 LAB — BASIC METABOLIC PANEL WITH GFR
BUN/Creatinine Ratio: 25 (ref 12–28)
BUN: 49 mg/dL — ABNORMAL HIGH (ref 8–27)
CO2: 18 mmol/L — ABNORMAL LOW (ref 20–29)
Calcium: 10.1 mg/dL (ref 8.7–10.3)
Chloride: 107 mmol/L — ABNORMAL HIGH (ref 96–106)
Creatinine, Ser: 1.96 mg/dL — ABNORMAL HIGH (ref 0.57–1.00)
Glucose: 180 mg/dL — ABNORMAL HIGH (ref 70–99)
Potassium: 4.7 mmol/L (ref 3.5–5.2)
Sodium: 140 mmol/L (ref 134–144)
eGFR: 27 mL/min/{1.73_m2} — ABNORMAL LOW (ref >59)

## 2023-05-26 DIAGNOSIS — Z1231 Encounter for screening mammogram for malignant neoplasm of breast: Secondary | ICD-10-CM | POA: Diagnosis not present

## 2023-06-14 DIAGNOSIS — E1169 Type 2 diabetes mellitus with other specified complication: Secondary | ICD-10-CM | POA: Diagnosis not present

## 2023-06-14 DIAGNOSIS — I10 Essential (primary) hypertension: Secondary | ICD-10-CM | POA: Diagnosis not present

## 2023-07-06 ENCOUNTER — Ambulatory Visit: Payer: Medicare Other | Admitting: Cardiology

## 2023-07-12 ENCOUNTER — Other Ambulatory Visit: Payer: Self-pay

## 2023-07-12 DIAGNOSIS — N189 Chronic kidney disease, unspecified: Secondary | ICD-10-CM | POA: Insufficient documentation

## 2023-07-12 DIAGNOSIS — E039 Hypothyroidism, unspecified: Secondary | ICD-10-CM | POA: Insufficient documentation

## 2023-07-12 DIAGNOSIS — K219 Gastro-esophageal reflux disease without esophagitis: Secondary | ICD-10-CM | POA: Insufficient documentation

## 2023-07-12 DIAGNOSIS — I10 Essential (primary) hypertension: Secondary | ICD-10-CM | POA: Insufficient documentation

## 2023-07-12 DIAGNOSIS — M1990 Unspecified osteoarthritis, unspecified site: Secondary | ICD-10-CM | POA: Insufficient documentation

## 2023-07-12 DIAGNOSIS — E669 Obesity, unspecified: Secondary | ICD-10-CM | POA: Insufficient documentation

## 2023-07-12 DIAGNOSIS — E119 Type 2 diabetes mellitus without complications: Secondary | ICD-10-CM | POA: Insufficient documentation

## 2023-07-12 DIAGNOSIS — E7800 Pure hypercholesterolemia, unspecified: Secondary | ICD-10-CM | POA: Insufficient documentation

## 2023-07-13 ENCOUNTER — Ambulatory Visit: Attending: Cardiology | Admitting: Cardiology

## 2023-07-13 ENCOUNTER — Encounter: Payer: Self-pay | Admitting: Cardiology

## 2023-07-13 VITALS — BP 138/60 | HR 93 | Ht 64.0 in | Wt 162.0 lb

## 2023-07-13 DIAGNOSIS — I10 Essential (primary) hypertension: Secondary | ICD-10-CM

## 2023-07-13 DIAGNOSIS — R9439 Abnormal result of other cardiovascular function study: Secondary | ICD-10-CM

## 2023-07-13 DIAGNOSIS — E118 Type 2 diabetes mellitus with unspecified complications: Secondary | ICD-10-CM

## 2023-07-13 MED ORDER — ISOSORBIDE MONONITRATE ER 60 MG PO TB24
60.0000 mg | ORAL_TABLET | Freq: Every day | ORAL | 3 refills | Status: AC
Start: 2023-07-13 — End: ?

## 2023-07-13 NOTE — Progress Notes (Signed)
 Cardiology Office Note:    Date:  07/13/2023   ID:  Sherry Herrera, DOB 26-May-1955, MRN [id number]  PCP:  Orin Birk, MD  Cardiologist:  Ralene Burger, MD    Referring MD: Orin Birk, MD   Chief Complaint  Patient presents with   Follow-up    History of Present Illness:    Sherry Herrera is a 68 y.o. female past medical history significant for abnormal stress test done in August 2023 showing LAD ischemia also diminished ejection fraction 45 to 50%, essential hypertension, dyslipidemia, TIA, chronic shoulder pain and actually what started this ordeal about being a abnormal stress test is the fact that she needed surgical clearance she never ended up having surgery done.  The biggest difficulty we have right now is chronic kidney failure with creatinine in the neighborhood of 2.  She comes for regular follow-up denies having any extraordinary symptoms the problem is she has been sick for the last 2 weeks with cough and flulike symptoms.  She described to have chest pain sometimes with extreme exertion but at the same time she is able to walk and work with no difficulties.  Past Medical History:  Diagnosis Date   Abnormal stress test ischemia involving anterior wall 10/25/2021   AKI (acute kidney injury) (HCC) 06/20/2021   Calculus of gallbladder with chronic cholecystitis without obstruction 11/18/2020   Chronic renal failure    Dyslipidemia 06/15/2021   Dyspnea on exertion 06/15/2021   Elevated troponin 06/20/2021   Essential (primary) hypertension    Essential hypertension 06/15/2021   GERD (gastroesophageal reflux disease)    History of colonic polyps 01/20/2021   History of TIA (transient ischemic attack) 06/15/2021   Hypomagnesemia 06/20/2021   Hypothyroidism    Normocytic anemia 06/20/2021   Obesity    Osteoarthritis    Right Shoulder   Postoperative examination 12/16/2020   Preprocedural general physical examination 01/20/2021   Prolonged QT interval  06/20/2021   Pure hypercholesterolemia    Syncope 06/20/2021   Transient ischemic attack 2006   left arm numbness; never confirmed. CT and MRI negative   Type 2 diabetes mellitus (HCC)    Type 2 diabetes mellitus with complication, without long-term current use of insulin  (HCC) 06/15/2021   Zoster 06/20/2021    Past Surgical History:  Procedure Laterality Date   CHOLECYSTECTOMY  11/2020   COLON RESECTION  08/2010   Partial colon resection-transvers colon for tubular adenoma   FOOT SURGERY Bilateral    Had small benign tumors removed   TUBAL LIGATION      Current Medications: Current Meds  Medication Sig   acetaminophen  (TYLENOL ) 500 MG tablet Take 500 mg by mouth every 6 (six) hours as needed for mild pain (pain score 1-3) or moderate pain (pain score 4-6).   amLODipine  (NORVASC ) 5 MG tablet Take 5 mg by mouth daily.   aspirin  EC 81 MG tablet Take 81 mg by mouth daily. Swallow whole.   atorvastatin  (LIPITOR ) 80 MG tablet Take 80 mg by mouth at bedtime.   dapagliflozin propanediol (FARXIGA) 10 MG TABS tablet Take 10 mg by mouth in the morning.   ezetimibe (ZETIA) 10 MG tablet Take 10 mg by mouth daily.   furosemide (LASIX) 20 MG tablet Take 20 mg by mouth 2 (two) times daily.   isosorbide  mononitrate (IMDUR ) 30 MG 24 hr tablet Take 30 mg by mouth daily.   levothyroxine  (SYNTHROID ) 88 MCG tablet Take 88 mcg by mouth daily before breakfast.   nebivolol (BYSTOLIC) 5 MG tablet  Take 10 mg by mouth daily.   olmesartan  (BENICAR ) 20 MG tablet Take 1 tablet (20 mg total) by mouth daily.   potassium chloride (KLOR-CON M) 10 MEQ tablet Take 20 mEq by mouth daily.   ranolazine  (RANEXA ) 500 MG 12 hr tablet TAKE 1 TABLET BY MOUTH 2 TIMES DAILY.   sertraline (ZOLOFT) 100 MG tablet Take 100 mg by mouth daily.     Allergies:   Lisinopril   Social History   Socioeconomic History   Marital status: Widowed    Spouse name: Not on file   Number of children: Not on file   Years of education:  Not on file   Highest education level: Not on file  Occupational History   Not on file  Tobacco Use   Smoking status: Never   Smokeless tobacco: Never  Substance and Sexual Activity   Alcohol  use: Never   Drug use: Not on file   Sexual activity: Not on file  Other Topics Concern   Not on file  Social History Narrative   Not on file   Social Drivers of Health   Financial Resource Strain: Not on file  Food Insecurity: Not on file  Transportation Needs: Not on file  Physical Activity: Not on file  Stress: Not on file  Social Connections: Not on file     Family History: The patient's family history includes Diabetes in her mother; Heart attack in her father; Hyperlipidemia in her sister and sister; Hypertension in her brother, mother, sister, and sister; Kidney failure in her mother; Osteoarthritis in her mother; Stroke in her mother. ROS:   Please see the history of present illness.    All 14 point review of systems negative except as described per history of present illness  EKGs/Labs/Other Studies Reviewed:         Recent Labs: 12/17/2022: TSH 0.48 05/12/2023: BUN 49; Creatinine, Ser 1.96; Potassium 4.7; Sodium 140  Recent Lipid Panel    Component Value Date/Time   CHOL (H) 03/30/2007 0845    234        ATP III CLASSIFICATION:  <200     mg/dL   Desirable  098-119  mg/dL   Borderline High  >=147    mg/dL   High   TRIG 829 (H) 56/21/3086 0845   HDL 38 (L) 03/30/2007 0845   CHOLHDL 6.2 03/30/2007 0845   VLDL 34 03/30/2007 0845   LDLCALC (H) 03/30/2007 0845    162        Total Cholesterol/HDL:CHD Risk Coronary Heart Disease Risk Table                     Men   Women  1/2 Average Risk   3.4   3.3    Physical Exam:    VS:  BP 138/60   Pulse 93   Ht 5\' 4"  (1.626 m)   Wt 162 lb (73.5 kg)   SpO2 96%   BMI 27.81 kg/m     Wt Readings from Last 3 Encounters:  07/13/23 162 lb (73.5 kg)  04/07/23 161 lb 9.6 oz (73.3 kg)  01/10/23 154 lb (69.9 kg)     GEN:   Well nourished, well developed in no acute distress HEENT: Normal NECK: No JVD; No carotid bruits LYMPHATICS: No lymphadenopathy CARDIAC: RRR, no murmurs, no rubs, no gallops RESPIRATORY:  Clear to auscultation without rales, wheezing or rhonchi  ABDOMEN: Soft, non-tender, non-distended MUSCULOSKELETAL:  No edema; No deformity  SKIN: Warm and dry  LOWER EXTREMITIES: no swelling NEUROLOGIC:  Alert and oriented x 3 PSYCHIATRIC:  Normal affect   ASSESSMENT:    1. Abnormal stress test ischemia involving anterior wall   2. Essential (primary) hypertension   3. Type 2 diabetes mellitus with complication, without long-term current use of insulin  (HCC)    PLAN:    In order of problems listed above:  Abnormal stress test in this lady with multiple risk factors for coronary artery disease and pain which is concerning I will augment her medical therapy will increase dose of Imdur  from 30 to 60 mg daily continue rest of the medication.  Will avoid any acute intervention because of kidney dysfunction.  More if we do cardiac catheterization would put her on dialysis which obviously we try to avoid so for medical therapy working. Essential hypertension blood pressure well-controlled continue present management. Dyslipidemia that being addressed and followed by internal medicine team   Medication Adjustments/Labs and Tests Ordered: Current medicines are reviewed at length with the patient today.  Concerns regarding medicines are outlined above.  No orders of the defined types were placed in this encounter.  Medication changes: No orders of the defined types were placed in this encounter.   Signed, Manfred Seed, MD, [facility] 07/13/2023 4:47 PM     [facility]

## 2023-07-13 NOTE — Patient Instructions (Signed)
 Medication Instructions:  Your physician has recommended you make the following change in your medication:   START: Imdur  60 mg daily  *If you need a refill on your cardiac medications before your next appointment, please call your pharmacy*  Lab Work: None If you have labs (blood work) drawn today and your tests are completely normal, you will receive your results only by: MyChart Message (if you have MyChart) OR A paper copy in the mail If you have any lab test that is abnormal or we need to change your treatment, we will call you to review the results.  Testing/Procedures: None  Follow-Up: At [facility], you and your health needs are our priority.  As part of our continuing mission to provide you with exceptional heart care, our providers are all part of one team.  This team includes your primary Cardiologist (physician) and Advanced Practice Providers or APPs (Physician Assistants and Nurse Practitioners) who all work together to provide you with the care you need, when you need it.  Your next appointment:   5 month(s)  Provider:   Ralene Burger, MD    We recommend signing up for the patient portal called "MyChart".  Sign up information is provided on this After Visit Summary.  MyChart is used to connect with patients for Virtual Visits (Telemedicine).  Patients are able to view lab/test results, encounter notes, upcoming appointments, etc.  Non-urgent messages can be sent to your provider as well.   To learn more about what you can do with MyChart, go to [email or web address].   Other Instructions None

## 2023-07-13 NOTE — Addendum Note (Signed)
 Addended by: Aurelio Leer I on: 07/13/2023 05:02 PM   Modules accepted: Orders

## 2023-07-15 DIAGNOSIS — E1169 Type 2 diabetes mellitus with other specified complication: Secondary | ICD-10-CM | POA: Diagnosis not present

## 2023-07-15 DIAGNOSIS — K219 Gastro-esophageal reflux disease without esophagitis: Secondary | ICD-10-CM | POA: Diagnosis not present

## 2023-07-15 DIAGNOSIS — Z79899 Other long term (current) drug therapy: Secondary | ICD-10-CM | POA: Diagnosis not present

## 2023-07-25 DIAGNOSIS — I129 Hypertensive chronic kidney disease with stage 1 through stage 4 chronic kidney disease, or unspecified chronic kidney disease: Secondary | ICD-10-CM | POA: Diagnosis not present

## 2023-07-25 DIAGNOSIS — N1832 Chronic kidney disease, stage 3b: Secondary | ICD-10-CM | POA: Diagnosis not present

## 2023-07-25 DIAGNOSIS — I5030 Unspecified diastolic (congestive) heart failure: Secondary | ICD-10-CM | POA: Diagnosis not present

## 2023-07-25 DIAGNOSIS — E785 Hyperlipidemia, unspecified: Secondary | ICD-10-CM | POA: Diagnosis not present

## 2023-07-25 DIAGNOSIS — E1122 Type 2 diabetes mellitus with diabetic chronic kidney disease: Secondary | ICD-10-CM | POA: Diagnosis not present

## 2023-08-14 DIAGNOSIS — N1832 Chronic kidney disease, stage 3b: Secondary | ICD-10-CM | POA: Diagnosis not present

## 2023-08-14 DIAGNOSIS — E1169 Type 2 diabetes mellitus with other specified complication: Secondary | ICD-10-CM | POA: Diagnosis not present

## 2023-08-14 DIAGNOSIS — Z79899 Other long term (current) drug therapy: Secondary | ICD-10-CM | POA: Diagnosis not present

## 2023-08-14 DIAGNOSIS — I5032 Chronic diastolic (congestive) heart failure: Secondary | ICD-10-CM | POA: Diagnosis not present

## 2023-09-04 DIAGNOSIS — I5032 Chronic diastolic (congestive) heart failure: Secondary | ICD-10-CM | POA: Diagnosis not present

## 2023-09-04 DIAGNOSIS — N2581 Secondary hyperparathyroidism of renal origin: Secondary | ICD-10-CM | POA: Diagnosis not present

## 2023-09-04 DIAGNOSIS — E039 Hypothyroidism, unspecified: Secondary | ICD-10-CM | POA: Diagnosis not present

## 2023-09-04 DIAGNOSIS — B379 Candidiasis, unspecified: Secondary | ICD-10-CM | POA: Diagnosis not present

## 2023-09-04 DIAGNOSIS — N1832 Chronic kidney disease, stage 3b: Secondary | ICD-10-CM | POA: Diagnosis not present

## 2023-09-04 DIAGNOSIS — E1169 Type 2 diabetes mellitus with other specified complication: Secondary | ICD-10-CM | POA: Diagnosis not present

## 2023-09-04 DIAGNOSIS — I10 Essential (primary) hypertension: Secondary | ICD-10-CM | POA: Diagnosis not present

## 2023-09-04 DIAGNOSIS — K219 Gastro-esophageal reflux disease without esophagitis: Secondary | ICD-10-CM | POA: Diagnosis not present

## 2023-09-07 DIAGNOSIS — N1832 Chronic kidney disease, stage 3b: Secondary | ICD-10-CM | POA: Diagnosis not present

## 2023-09-07 DIAGNOSIS — N189 Chronic kidney disease, unspecified: Secondary | ICD-10-CM | POA: Diagnosis not present

## 2023-09-14 DIAGNOSIS — E1169 Type 2 diabetes mellitus with other specified complication: Secondary | ICD-10-CM | POA: Diagnosis not present

## 2023-09-14 DIAGNOSIS — N184 Chronic kidney disease, stage 4 (severe): Secondary | ICD-10-CM | POA: Diagnosis not present

## 2023-09-14 DIAGNOSIS — K219 Gastro-esophageal reflux disease without esophagitis: Secondary | ICD-10-CM | POA: Diagnosis not present

## 2023-09-14 DIAGNOSIS — Z79899 Other long term (current) drug therapy: Secondary | ICD-10-CM | POA: Diagnosis not present

## 2023-09-26 ENCOUNTER — Other Ambulatory Visit: Payer: Self-pay | Admitting: Cardiology

## 2023-10-09 DIAGNOSIS — D492 Neoplasm of unspecified behavior of bone, soft tissue, and skin: Secondary | ICD-10-CM | POA: Diagnosis not present

## 2023-10-15 DIAGNOSIS — N184 Chronic kidney disease, stage 4 (severe): Secondary | ICD-10-CM | POA: Diagnosis not present

## 2023-10-15 DIAGNOSIS — I5032 Chronic diastolic (congestive) heart failure: Secondary | ICD-10-CM | POA: Diagnosis not present

## 2023-10-15 DIAGNOSIS — K219 Gastro-esophageal reflux disease without esophagitis: Secondary | ICD-10-CM | POA: Diagnosis not present

## 2023-10-15 DIAGNOSIS — Z79899 Other long term (current) drug therapy: Secondary | ICD-10-CM | POA: Diagnosis not present

## 2023-10-31 DIAGNOSIS — M19011 Primary osteoarthritis, right shoulder: Secondary | ICD-10-CM | POA: Diagnosis not present

## 2023-11-01 DIAGNOSIS — C44319 Basal cell carcinoma of skin of other parts of face: Secondary | ICD-10-CM | POA: Diagnosis not present

## 2023-11-01 DIAGNOSIS — L570 Actinic keratosis: Secondary | ICD-10-CM | POA: Diagnosis not present

## 2023-11-01 DIAGNOSIS — B0223 Postherpetic polyneuropathy: Secondary | ICD-10-CM | POA: Diagnosis not present

## 2023-11-07 DIAGNOSIS — C44329 Squamous cell carcinoma of skin of other parts of face: Secondary | ICD-10-CM | POA: Diagnosis not present

## 2023-11-14 DIAGNOSIS — N184 Chronic kidney disease, stage 4 (severe): Secondary | ICD-10-CM | POA: Diagnosis not present

## 2023-11-14 DIAGNOSIS — Z79899 Other long term (current) drug therapy: Secondary | ICD-10-CM | POA: Diagnosis not present

## 2023-11-28 ENCOUNTER — Other Ambulatory Visit: Payer: Self-pay | Admitting: Cardiology

## 2023-12-15 LAB — LAB REPORT - SCANNED
A1c: 6.1
EGFR: 30
Free T4: 1.08 ng/dL
TSH: 2.06 (ref 0.41–5.90)

## 2024-03-04 IMAGING — CT CT HEAD W/O CM
4 series · 16 of 47 positions shown, 18 images · non-contrast
Comparison: 03/29/2007 CT

CLINICAL DATA: Syncope.



[Series 2: head wo · axial · 0.42mm/px · z∈[+1373,+1488]mm · 7 of 31 slices shown, 9 images]
[im 4/31  brain]
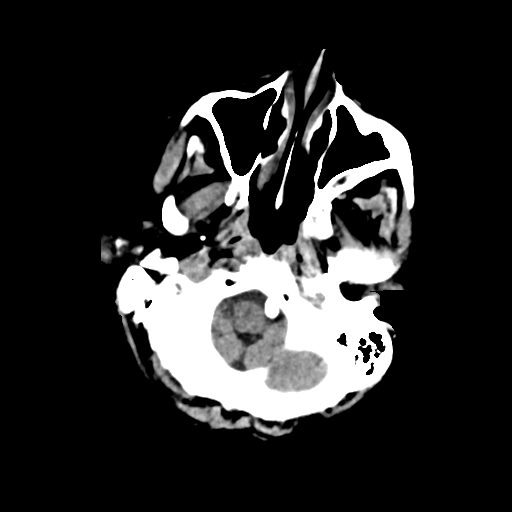
[im 4/31  bone]
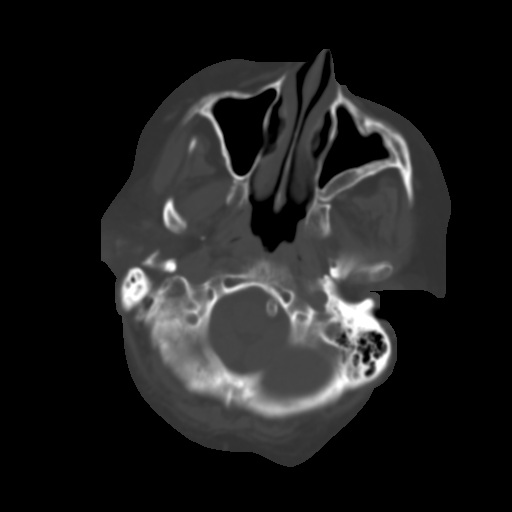
[im 8/31  brain]
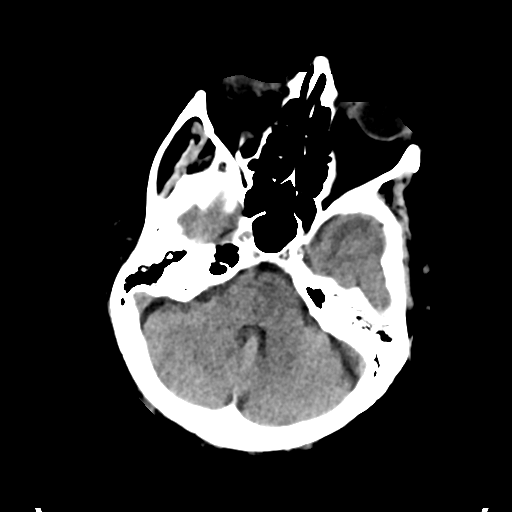
[im 12/31  brain]
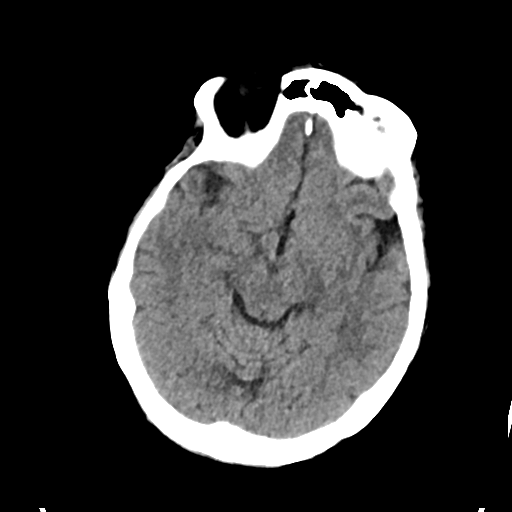
[im 16/31  brain]
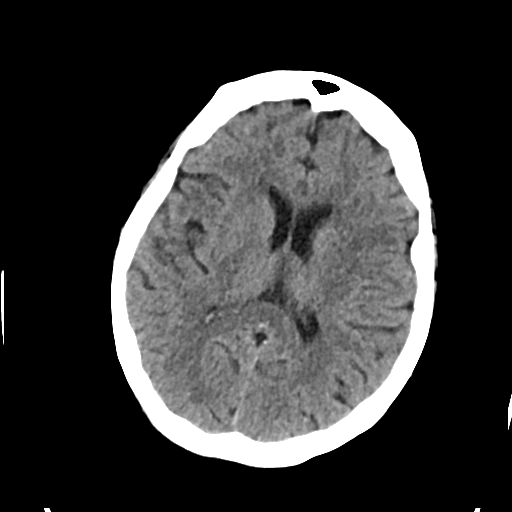
[im 19/31  brain]
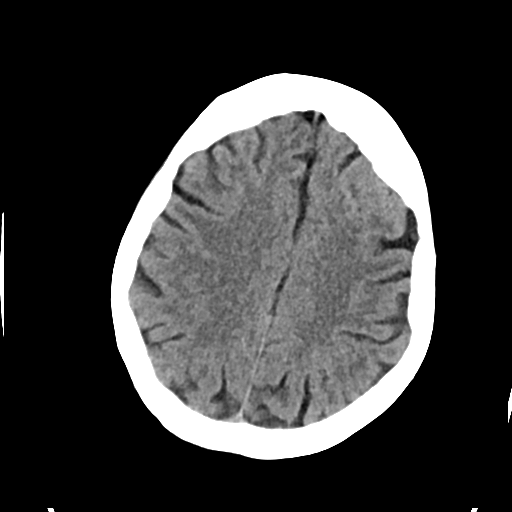
[im 19/31  bone]
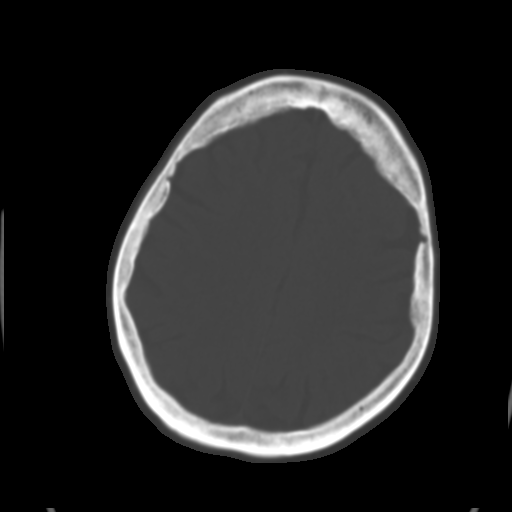
[im 23/31  brain]
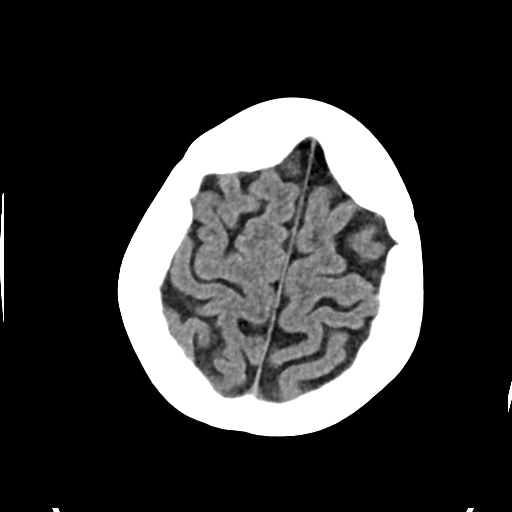
[im 27/31  brain]
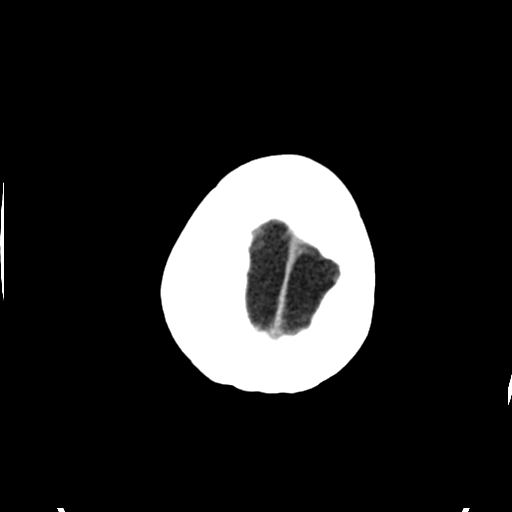

[Series 3: head bone · axial · 0.42mm/px · z∈[+1372,+1402]mm · 3 of 77 slices shown]
[im 8/77  bone]
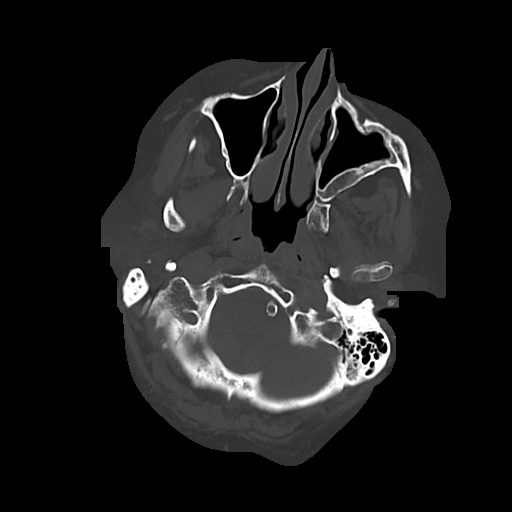
[im 16/77  bone]
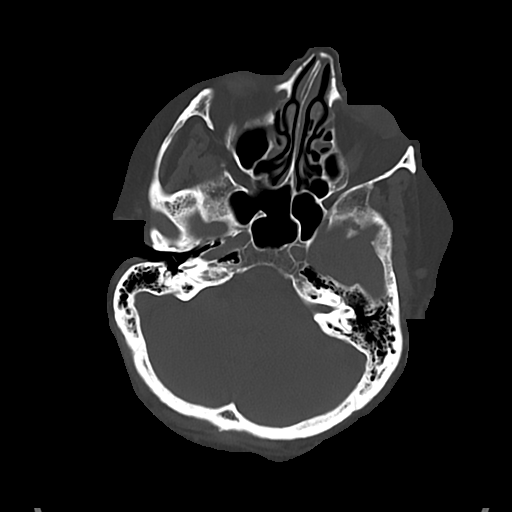
[im 23/77  bone]
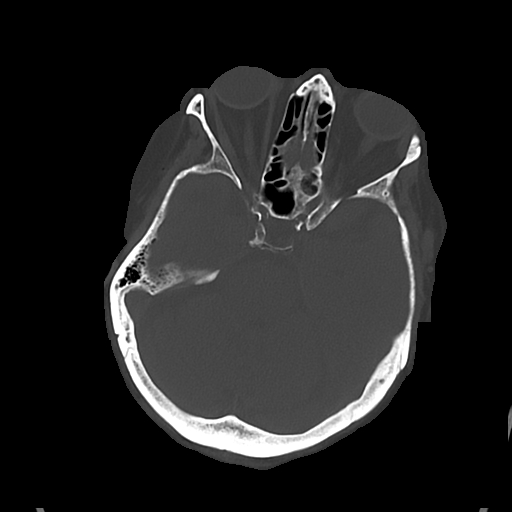

[Series 4: cor soft · coronal · 0.29mm/px · 3 of 66 slices shown]
[im 22/66  brain]
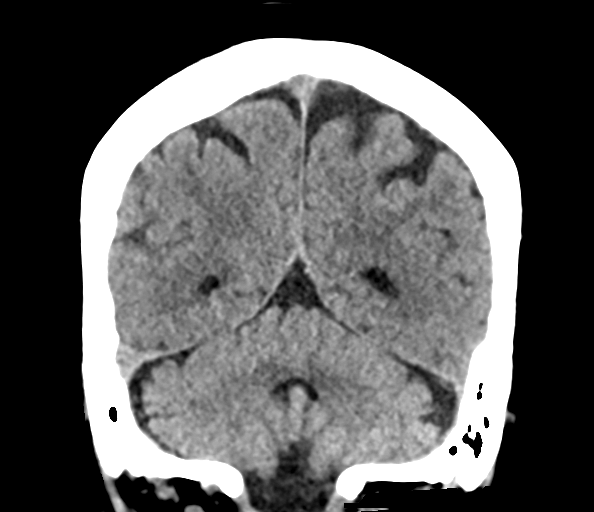
[im 29/66  brain]
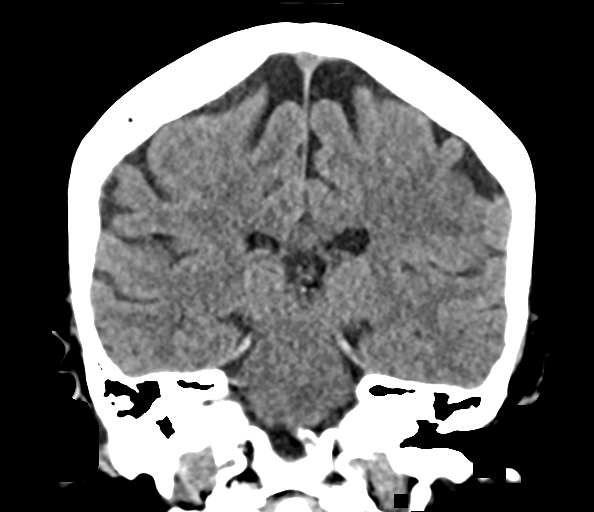
[im 37/66  brain]
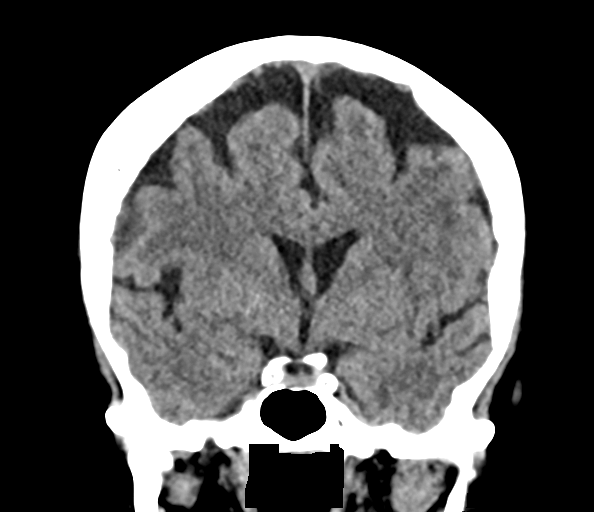

[Series 5: sag soft · sagittal · 0.29mm/px · 3 of 59 slices shown]
[im 20/59  brain]
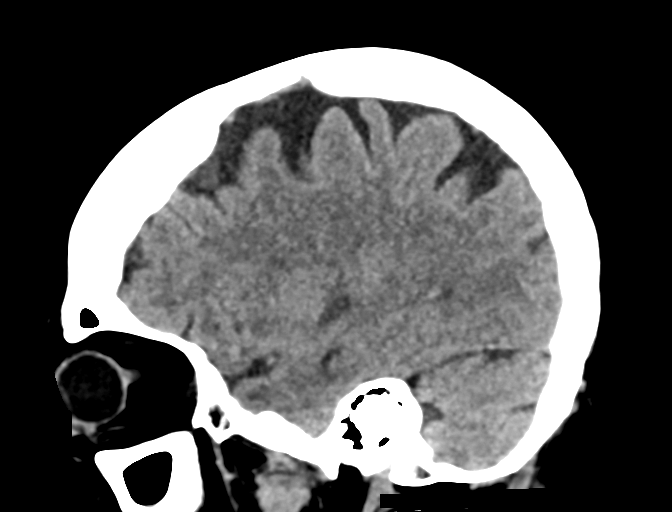
[im 30/59  brain]
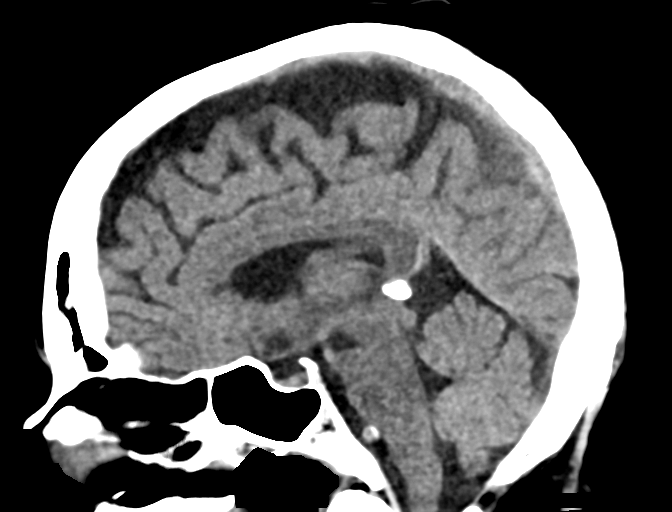
[im 39/59  brain]
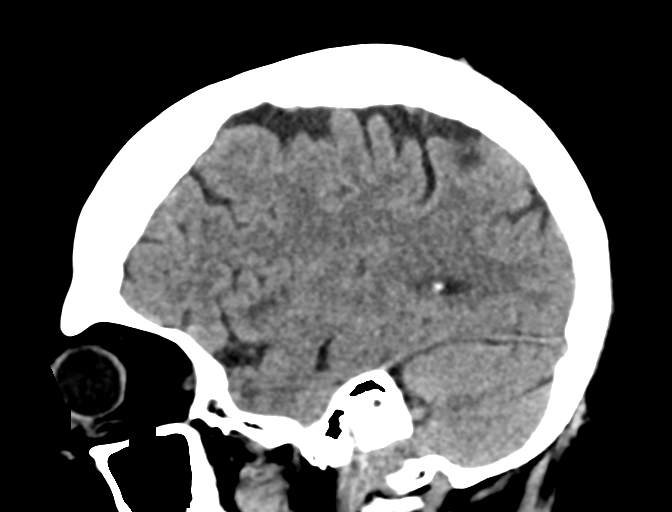

[16 of 47 positions shown; findings below may reference images not displayed]

FINDINGS: Brain: No evidence of acute infarction, hemorrhage, hydrocephalus,
extra-axial collection or mass lesion/mass effect.

Atrophy and chronic small-vessel white matter ischemic changes again
noted.

Vascular: Carotid and vertebral atherosclerotic calcifications are
noted.

Skull: No acute abnormality.

Sinuses/Orbits: No acute abnormality

Other: None.
IMPRESSION: 1. No evidence of acute intracranial abnormality.
2. Atrophy and chronic small-vessel white matter ischemic changes.

## 2024-03-04 IMAGING — CR DG CHEST 1V
1 series · 1 of 1 positions shown · non-contrast
Comparison: 07/08/2020 and prior radiographs

CLINICAL DATA: Syncope

EXAM:
CHEST  1 VIEW

[chest ap]
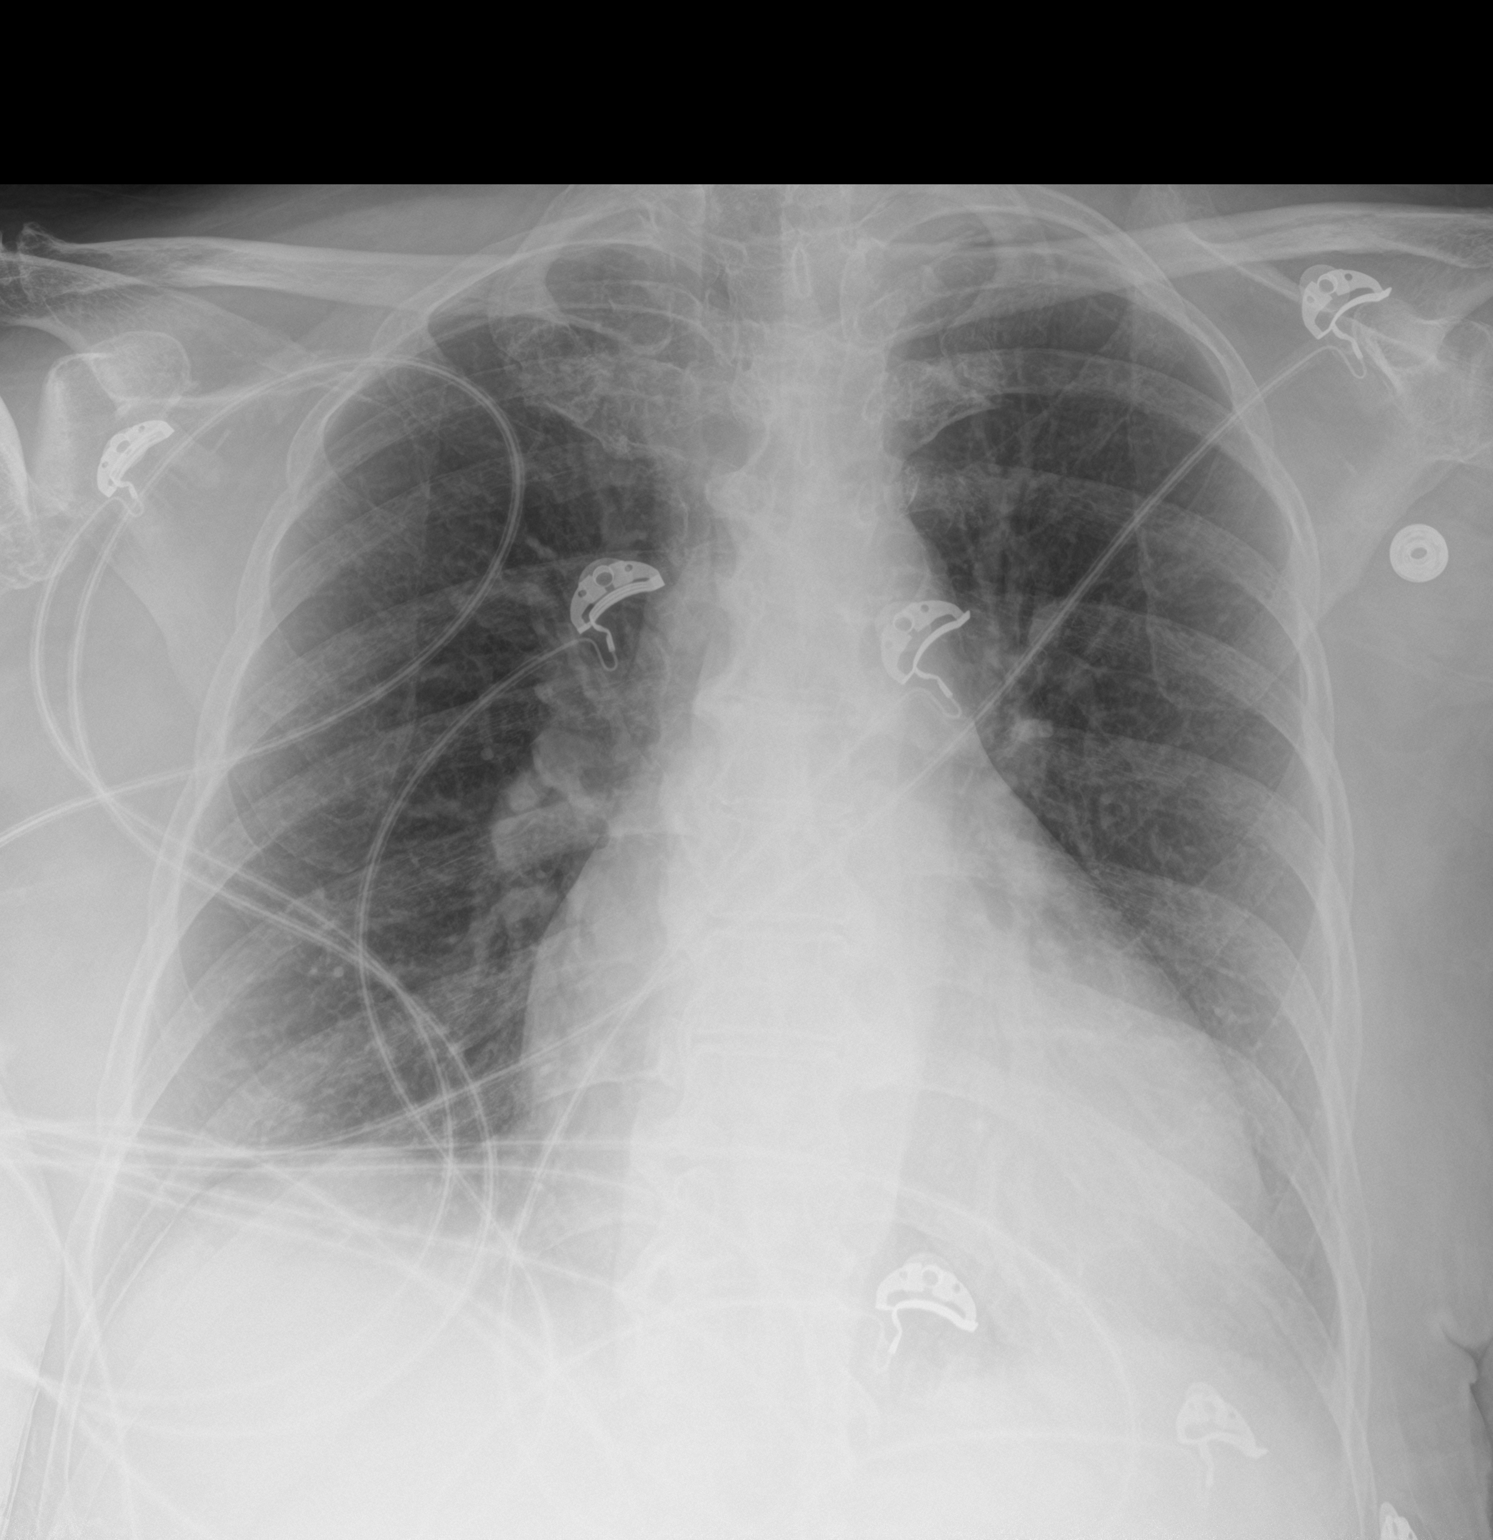

[1 of 1 positions shown; findings below may reference images not displayed]

FINDINGS: Cardiomegaly again noted.

RIGHT hemidiaphragm elevation is again identified.

There is no evidence of focal airspace disease, pulmonary edema,
suspicious pulmonary nodule/mass, pleural effusion, or pneumothorax.

No acute bony abnormalities are identified.
IMPRESSION: Cardiomegaly without evidence of acute cardiopulmonary disease.
